# Patient Record
Sex: Female | Born: 1960 | Race: White | Hispanic: No | Smoking: Former smoker
Health system: Southern US, Community
[De-identification: ages and names within clinical notes are randomized; demographics above are authoritative.]

## PROBLEM LIST (undated history)

## (undated) DIAGNOSIS — I10 Essential (primary) hypertension: Secondary | ICD-10-CM

## (undated) DIAGNOSIS — N289 Disorder of kidney and ureter, unspecified: Secondary | ICD-10-CM

## (undated) DIAGNOSIS — K219 Gastro-esophageal reflux disease without esophagitis: Secondary | ICD-10-CM

## (undated) DIAGNOSIS — E119 Type 2 diabetes mellitus without complications: Secondary | ICD-10-CM

## (undated) DIAGNOSIS — K7460 Unspecified cirrhosis of liver: Secondary | ICD-10-CM

## (undated) HISTORY — DX: Gastro-esophageal reflux disease without esophagitis: K21.9

---

## 2001-01-06 ENCOUNTER — Encounter: Admission: RE | Admit: 2001-01-06 | Discharge: 2001-01-06 | Payer: Self-pay | Admitting: *Deleted

## 2001-01-06 ENCOUNTER — Encounter: Payer: Self-pay | Admitting: *Deleted

## 2001-04-16 ENCOUNTER — Encounter (INDEPENDENT_AMBULATORY_CARE_PROVIDER_SITE_OTHER): Payer: Self-pay | Admitting: Specialist

## 2001-04-16 ENCOUNTER — Ambulatory Visit (HOSPITAL_BASED_OUTPATIENT_CLINIC_OR_DEPARTMENT_OTHER): Admission: RE | Admit: 2001-04-16 | Discharge: 2001-04-16 | Payer: Self-pay | Admitting: Plastic Surgery

## 2002-02-19 ENCOUNTER — Encounter: Admission: RE | Admit: 2002-02-19 | Discharge: 2002-02-19 | Payer: Self-pay | Admitting: *Deleted

## 2002-02-19 ENCOUNTER — Encounter: Payer: Self-pay | Admitting: *Deleted

## 2003-08-26 ENCOUNTER — Ambulatory Visit (HOSPITAL_COMMUNITY): Admission: RE | Admit: 2003-08-26 | Discharge: 2003-08-26 | Payer: Self-pay | Admitting: Family Medicine

## 2004-01-27 ENCOUNTER — Other Ambulatory Visit: Admission: RE | Admit: 2004-01-27 | Discharge: 2004-01-27 | Payer: Self-pay | Admitting: Family Medicine

## 2004-06-01 ENCOUNTER — Other Ambulatory Visit: Admission: RE | Admit: 2004-06-01 | Discharge: 2004-06-01 | Payer: Self-pay | Admitting: Family Medicine

## 2004-10-05 ENCOUNTER — Ambulatory Visit (HOSPITAL_COMMUNITY): Admission: RE | Admit: 2004-10-05 | Discharge: 2004-10-05 | Payer: Self-pay | Admitting: Family Medicine

## 2004-12-21 ENCOUNTER — Other Ambulatory Visit: Admission: RE | Admit: 2004-12-21 | Discharge: 2004-12-21 | Payer: Self-pay | Admitting: Family Medicine

## 2005-06-18 ENCOUNTER — Other Ambulatory Visit: Admission: RE | Admit: 2005-06-18 | Discharge: 2005-06-18 | Payer: Self-pay | Admitting: Family Medicine

## 2005-12-13 ENCOUNTER — Ambulatory Visit (HOSPITAL_COMMUNITY): Admission: RE | Admit: 2005-12-13 | Discharge: 2005-12-13 | Payer: Self-pay | Admitting: Family Medicine

## 2005-12-13 ENCOUNTER — Other Ambulatory Visit: Admission: RE | Admit: 2005-12-13 | Discharge: 2005-12-13 | Payer: Self-pay | Admitting: Family Medicine

## 2006-12-19 ENCOUNTER — Ambulatory Visit (HOSPITAL_COMMUNITY): Admission: RE | Admit: 2006-12-19 | Discharge: 2006-12-19 | Payer: Self-pay | Admitting: Family Medicine

## 2007-02-09 ENCOUNTER — Other Ambulatory Visit: Admission: RE | Admit: 2007-02-09 | Discharge: 2007-02-09 | Payer: Self-pay | Admitting: Family Medicine

## 2007-12-28 ENCOUNTER — Ambulatory Visit (HOSPITAL_COMMUNITY): Admission: RE | Admit: 2007-12-28 | Discharge: 2007-12-28 | Payer: Self-pay | Admitting: Family Medicine

## 2008-12-29 ENCOUNTER — Ambulatory Visit (HOSPITAL_COMMUNITY): Admission: RE | Admit: 2008-12-29 | Discharge: 2008-12-29 | Payer: Self-pay | Admitting: Family Medicine

## 2010-01-01 ENCOUNTER — Ambulatory Visit (HOSPITAL_COMMUNITY): Admission: RE | Admit: 2010-01-01 | Discharge: 2010-01-01 | Payer: Self-pay | Admitting: Family Medicine

## 2010-07-27 NOTE — Op Note (Signed)
[place]. [facility]  Patient:    Chelsea Ross, Chelsea Ross Visit Number: [id number] MRN: [id number]          Service Type: DSU Location: [facility] Attending Physician:  Eloise Levels Dictated by:   Mary A. Contogiannis, M.D. Proc. 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  PROCEDURES: 1. Excision of 1.3 cm scar and wound, left nasal tip. 2. Complex closure of 2.7 cm left nasal scar.  SURGEON:  Mary A. Contogiannis, M.D.  ANESTHESIA:  1% lidocaine with epinephrine.  COMPLICATIONS:  None.  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 Although the pathology report indicated that it was excised with clear margins, the primary closure on the incision came open and the wound healed in by secondary intention.  At this point all the wound has healed in; however, there is a significant deformity on the patients nose.  She would like to see about having this excised and having a reclosure of the incision.  She asked [place] to proceed with the surgery at this time.  DESCRIPTION OF PROCEDURE:  The patient was brought into the minor room and placed on the table in the supine position.  The face was prepped with Betadine and draped in a sterile fashion.  The skin and subcutaneous tissues in the area of the scar were then injected with 1% lidocaine with epinephrine. After adequate hemostasis and anesthesia had taken effect, the procedure was begun.  The old scar tissue was sharply excised.  This was then passed off the table to undergo permanent pathologic section evaluation.  There was a significant amount of undermining of the skin flaps performed in  all directions to loosen the nasal skin.  The wound was then once again closed in complex fashion.  The deeper subcutaneous tissues were closed with 4-0 Monocryl suture.  The dermal layer was closed with 4-0 Monocryl suture as well.  The skin was then closed with a 6-0 Prolene interrupted simple sutures. The incision was dressed with bacitracin ointment and a Band-Aid.  There were no complications.  The patient tolerated the procedure well.  She was then taught proper wound care and discharged home in stable condition.  Follow-up appointment will be tomorrow in the office. Dictated by:   Mary A. Contogiannis, M.D. Attending Physician:  Eloise Levels DD:  04/17/01 TD:  04/18/01 Job: [phone] [id number]

## 2010-12-10 ENCOUNTER — Other Ambulatory Visit (HOSPITAL_COMMUNITY): Payer: Self-pay | Admitting: Family Medicine

## 2010-12-10 DIAGNOSIS — Z1231 Encounter for screening mammogram for malignant neoplasm of breast: Secondary | ICD-10-CM

## 2011-01-04 ENCOUNTER — Ambulatory Visit (HOSPITAL_COMMUNITY): Payer: Self-pay

## 2011-01-15 ENCOUNTER — Ambulatory Visit (HOSPITAL_COMMUNITY): Payer: Self-pay | Attending: Family Medicine

## 2011-02-21 ENCOUNTER — Ambulatory Visit (HOSPITAL_COMMUNITY)
Admission: RE | Admit: 2011-02-21 | Discharge: 2011-02-21 | Disposition: A | Discharge status: Home / Self Care | Payer: BC Managed Care – PPO | Source: Ambulatory Visit | Attending: Family Medicine | Admitting: Family Medicine

## 2011-02-21 DIAGNOSIS — Z1231 Encounter for screening mammogram for malignant neoplasm of breast: Secondary | ICD-10-CM | POA: Insufficient documentation

## 2012-03-17 ENCOUNTER — Other Ambulatory Visit (HOSPITAL_COMMUNITY): Payer: Self-pay | Admitting: Family Medicine

## 2012-03-17 DIAGNOSIS — Z1231 Encounter for screening mammogram for malignant neoplasm of breast: Secondary | ICD-10-CM

## 2012-03-26 ENCOUNTER — Ambulatory Visit (HOSPITAL_COMMUNITY)
Admission: RE | Admit: 2012-03-26 | Discharge: 2012-03-26 | Disposition: A | Discharge status: Home / Self Care | Payer: 59 | Source: Ambulatory Visit | Attending: Family Medicine | Admitting: Family Medicine

## 2012-03-26 DIAGNOSIS — Z1231 Encounter for screening mammogram for malignant neoplasm of breast: Secondary | ICD-10-CM

## 2013-03-22 ENCOUNTER — Other Ambulatory Visit (HOSPITAL_COMMUNITY): Payer: Self-pay | Admitting: Family Medicine

## 2013-03-22 DIAGNOSIS — Z1231 Encounter for screening mammogram for malignant neoplasm of breast: Secondary | ICD-10-CM

## 2013-03-30 ENCOUNTER — Ambulatory Visit (HOSPITAL_COMMUNITY)
Admission: RE | Admit: 2013-03-30 | Discharge: 2013-03-30 | Disposition: A | Discharge status: Home / Self Care | Payer: 59 | Source: Ambulatory Visit | Attending: Family Medicine | Admitting: Family Medicine

## 2013-03-30 DIAGNOSIS — Z1231 Encounter for screening mammogram for malignant neoplasm of breast: Secondary | ICD-10-CM | POA: Insufficient documentation

## 2014-02-28 ENCOUNTER — Other Ambulatory Visit (HOSPITAL_COMMUNITY): Payer: Self-pay | Admitting: Family Medicine

## 2014-02-28 DIAGNOSIS — Z1231 Encounter for screening mammogram for malignant neoplasm of breast: Secondary | ICD-10-CM

## 2014-03-30 ENCOUNTER — Other Ambulatory Visit (HOSPITAL_COMMUNITY)
Admission: RE | Admit: 2014-03-30 | Discharge: 2014-03-30 | Disposition: A | Discharge status: Home / Self Care | Payer: 59 | Source: Ambulatory Visit | Attending: Family Medicine | Admitting: Family Medicine

## 2014-03-30 ENCOUNTER — Other Ambulatory Visit: Payer: Self-pay | Admitting: Physician Assistant

## 2014-03-30 DIAGNOSIS — Z01419 Encounter for gynecological examination (general) (routine) without abnormal findings: Secondary | ICD-10-CM | POA: Insufficient documentation

## 2014-03-30 DIAGNOSIS — Z1151 Encounter for screening for human papillomavirus (HPV): Secondary | ICD-10-CM | POA: Diagnosis present

## 2014-03-31 ENCOUNTER — Ambulatory Visit (HOSPITAL_COMMUNITY)
Admission: RE | Admit: 2014-03-31 | Discharge: 2014-03-31 | Disposition: A | Discharge status: Home / Self Care | Payer: 59 | Source: Ambulatory Visit | Attending: Family Medicine | Admitting: Family Medicine

## 2014-03-31 DIAGNOSIS — Z1231 Encounter for screening mammogram for malignant neoplasm of breast: Secondary | ICD-10-CM | POA: Diagnosis present

## 2014-03-31 LAB — CYTOLOGY - PAP

## 2015-03-08 ENCOUNTER — Other Ambulatory Visit: Payer: Self-pay

## 2015-03-08 DIAGNOSIS — Z1231 Encounter for screening mammogram for malignant neoplasm of breast: Secondary | ICD-10-CM

## 2015-04-04 ENCOUNTER — Ambulatory Visit
Admission: RE | Admit: 2015-04-04 | Discharge: 2015-04-04 | Disposition: A | Discharge status: Home / Self Care | Payer: 59 | Source: Ambulatory Visit

## 2015-04-04 DIAGNOSIS — Z1231 Encounter for screening mammogram for malignant neoplasm of breast: Secondary | ICD-10-CM

## 2016-03-12 ENCOUNTER — Other Ambulatory Visit: Payer: Self-pay | Admitting: Family Medicine

## 2016-03-12 DIAGNOSIS — Z1231 Encounter for screening mammogram for malignant neoplasm of breast: Secondary | ICD-10-CM

## 2016-04-04 ENCOUNTER — Ambulatory Visit
Admission: RE | Admit: 2016-04-04 | Discharge: 2016-04-04 | Disposition: A | Discharge status: Home / Self Care | Payer: 59 | Source: Ambulatory Visit | Attending: Family Medicine | Admitting: Family Medicine

## 2016-04-04 DIAGNOSIS — Z1231 Encounter for screening mammogram for malignant neoplasm of breast: Secondary | ICD-10-CM | POA: Diagnosis not present

## 2016-04-29 DIAGNOSIS — E119 Type 2 diabetes mellitus without complications: Secondary | ICD-10-CM | POA: Diagnosis not present

## 2016-04-29 DIAGNOSIS — K219 Gastro-esophageal reflux disease without esophagitis: Secondary | ICD-10-CM | POA: Diagnosis not present

## 2016-05-29 DIAGNOSIS — R809 Proteinuria, unspecified: Secondary | ICD-10-CM | POA: Diagnosis not present

## 2016-06-17 ENCOUNTER — Other Ambulatory Visit: Payer: Self-pay | Admitting: Nurse Practitioner

## 2016-06-17 ENCOUNTER — Other Ambulatory Visit: Payer: Self-pay | Admitting: Nephrology

## 2016-06-17 DIAGNOSIS — I10 Essential (primary) hypertension: Secondary | ICD-10-CM | POA: Diagnosis not present

## 2016-06-17 DIAGNOSIS — E1129 Type 2 diabetes mellitus with other diabetic kidney complication: Secondary | ICD-10-CM | POA: Diagnosis not present

## 2016-06-17 DIAGNOSIS — R809 Proteinuria, unspecified: Secondary | ICD-10-CM

## 2016-06-21 ENCOUNTER — Ambulatory Visit
Admission: RE | Admit: 2016-06-21 | Discharge: 2016-06-21 | Disposition: A | Discharge status: Home / Self Care | Payer: 59 | Source: Ambulatory Visit | Attending: Nephrology | Admitting: Nephrology

## 2016-06-21 DIAGNOSIS — R809 Proteinuria, unspecified: Secondary | ICD-10-CM

## 2016-06-21 DIAGNOSIS — N281 Cyst of kidney, acquired: Secondary | ICD-10-CM | POA: Diagnosis not present

## 2016-06-26 ENCOUNTER — Other Ambulatory Visit: Payer: Self-pay | Admitting: Nephrology

## 2016-06-26 DIAGNOSIS — N281 Cyst of kidney, acquired: Secondary | ICD-10-CM

## 2016-07-08 ENCOUNTER — Ambulatory Visit
Admission: RE | Admit: 2016-07-08 | Discharge: 2016-07-08 | Disposition: A | Discharge status: Home / Self Care | Payer: 59 | Source: Ambulatory Visit | Attending: Nephrology | Admitting: Nephrology

## 2016-07-08 DIAGNOSIS — N281 Cyst of kidney, acquired: Secondary | ICD-10-CM

## 2016-07-08 MED ORDER — GADOBENATE DIMEGLUMINE 529 MG/ML IV SOLN
12.0000 mL | Freq: Once | INTRAVENOUS | Status: AC | PRN
  Administered 2016-07-08: 12 mL via INTRAVENOUS

## 2016-07-10 DIAGNOSIS — R809 Proteinuria, unspecified: Secondary | ICD-10-CM | POA: Diagnosis not present

## 2016-07-24 DIAGNOSIS — I10 Essential (primary) hypertension: Secondary | ICD-10-CM | POA: Diagnosis not present

## 2016-07-24 DIAGNOSIS — R809 Proteinuria, unspecified: Secondary | ICD-10-CM | POA: Diagnosis not present

## 2016-07-24 DIAGNOSIS — R938 Abnormal findings on diagnostic imaging of other specified body structures: Secondary | ICD-10-CM | POA: Diagnosis not present

## 2016-07-24 DIAGNOSIS — E1129 Type 2 diabetes mellitus with other diabetic kidney complication: Secondary | ICD-10-CM | POA: Diagnosis not present

## 2016-07-31 DIAGNOSIS — L570 Actinic keratosis: Secondary | ICD-10-CM | POA: Diagnosis not present

## 2016-07-31 DIAGNOSIS — L309 Dermatitis, unspecified: Secondary | ICD-10-CM | POA: Diagnosis not present

## 2016-07-31 DIAGNOSIS — D485 Neoplasm of uncertain behavior of skin: Secondary | ICD-10-CM | POA: Diagnosis not present

## 2016-08-01 DIAGNOSIS — K219 Gastro-esophageal reflux disease without esophagitis: Secondary | ICD-10-CM | POA: Diagnosis not present

## 2016-08-01 DIAGNOSIS — E119 Type 2 diabetes mellitus without complications: Secondary | ICD-10-CM | POA: Diagnosis not present

## 2016-08-01 DIAGNOSIS — K8689 Other specified diseases of pancreas: Secondary | ICD-10-CM | POA: Diagnosis not present

## 2016-08-14 DIAGNOSIS — R809 Proteinuria, unspecified: Secondary | ICD-10-CM | POA: Diagnosis not present

## 2016-08-21 DIAGNOSIS — K8689 Other specified diseases of pancreas: Secondary | ICD-10-CM | POA: Diagnosis not present

## 2016-09-03 DIAGNOSIS — R933 Abnormal findings on diagnostic imaging of other parts of digestive tract: Secondary | ICD-10-CM | POA: Diagnosis not present

## 2016-09-03 DIAGNOSIS — K8689 Other specified diseases of pancreas: Secondary | ICD-10-CM | POA: Diagnosis not present

## 2016-09-03 DIAGNOSIS — K861 Other chronic pancreatitis: Secondary | ICD-10-CM | POA: Diagnosis not present

## 2016-09-25 DIAGNOSIS — L570 Actinic keratosis: Secondary | ICD-10-CM | POA: Diagnosis not present

## 2016-10-23 DIAGNOSIS — E559 Vitamin D deficiency, unspecified: Secondary | ICD-10-CM | POA: Diagnosis not present

## 2016-10-23 DIAGNOSIS — R809 Proteinuria, unspecified: Secondary | ICD-10-CM | POA: Diagnosis not present

## 2016-10-29 DIAGNOSIS — I10 Essential (primary) hypertension: Secondary | ICD-10-CM | POA: Diagnosis not present

## 2016-10-29 DIAGNOSIS — R809 Proteinuria, unspecified: Secondary | ICD-10-CM | POA: Diagnosis not present

## 2016-12-04 ENCOUNTER — Other Ambulatory Visit (HOSPITAL_COMMUNITY): Payer: Self-pay | Admitting: Nephrology

## 2016-12-04 DIAGNOSIS — R809 Proteinuria, unspecified: Secondary | ICD-10-CM

## 2016-12-05 DIAGNOSIS — K219 Gastro-esophageal reflux disease without esophagitis: Secondary | ICD-10-CM | POA: Diagnosis not present

## 2016-12-05 DIAGNOSIS — L659 Nonscarring hair loss, unspecified: Secondary | ICD-10-CM | POA: Diagnosis not present

## 2016-12-05 DIAGNOSIS — E119 Type 2 diabetes mellitus without complications: Secondary | ICD-10-CM | POA: Diagnosis not present

## 2016-12-05 DIAGNOSIS — R790 Abnormal level of blood mineral: Secondary | ICD-10-CM | POA: Diagnosis not present

## 2016-12-05 DIAGNOSIS — I10 Essential (primary) hypertension: Secondary | ICD-10-CM | POA: Diagnosis not present

## 2016-12-05 DIAGNOSIS — Z0000 Encounter for general adult medical examination without abnormal findings: Secondary | ICD-10-CM | POA: Diagnosis not present

## 2016-12-11 ENCOUNTER — Other Ambulatory Visit: Payer: Self-pay | Admitting: Nephrology

## 2016-12-11 DIAGNOSIS — R809 Proteinuria, unspecified: Secondary | ICD-10-CM

## 2016-12-11 DIAGNOSIS — N281 Cyst of kidney, acquired: Secondary | ICD-10-CM

## 2016-12-17 ENCOUNTER — Emergency Department (HOSPITAL_BASED_OUTPATIENT_CLINIC_OR_DEPARTMENT_OTHER)
Admission: EM | Admit: 2016-12-17 | Discharge: 2016-12-17 | Disposition: A | Discharge status: Home / Self Care | Payer: 59 | Attending: Emergency Medicine | Admitting: Emergency Medicine

## 2016-12-17 ENCOUNTER — Emergency Department (HOSPITAL_BASED_OUTPATIENT_CLINIC_OR_DEPARTMENT_OTHER): Payer: 59

## 2016-12-17 ENCOUNTER — Encounter (HOSPITAL_BASED_OUTPATIENT_CLINIC_OR_DEPARTMENT_OTHER): Payer: Self-pay | Admitting: Emergency Medicine

## 2016-12-17 DIAGNOSIS — Z79899 Other long term (current) drug therapy: Secondary | ICD-10-CM | POA: Diagnosis not present

## 2016-12-17 DIAGNOSIS — Y929 Unspecified place or not applicable: Secondary | ICD-10-CM | POA: Insufficient documentation

## 2016-12-17 DIAGNOSIS — S199XXA Unspecified injury of neck, initial encounter: Secondary | ICD-10-CM | POA: Diagnosis not present

## 2016-12-17 DIAGNOSIS — Y9384 Activity, sleeping: Secondary | ICD-10-CM | POA: Diagnosis not present

## 2016-12-17 DIAGNOSIS — S02609A Fracture of mandible, unspecified, initial encounter for closed fracture: Secondary | ICD-10-CM

## 2016-12-17 DIAGNOSIS — E119 Type 2 diabetes mellitus without complications: Secondary | ICD-10-CM | POA: Insufficient documentation

## 2016-12-17 DIAGNOSIS — Z7984 Long term (current) use of oral hypoglycemic drugs: Secondary | ICD-10-CM | POA: Insufficient documentation

## 2016-12-17 DIAGNOSIS — Y999 Unspecified external cause status: Secondary | ICD-10-CM | POA: Insufficient documentation

## 2016-12-17 DIAGNOSIS — R55 Syncope and collapse: Secondary | ICD-10-CM | POA: Diagnosis not present

## 2016-12-17 DIAGNOSIS — S0101XA Laceration without foreign body of scalp, initial encounter: Secondary | ICD-10-CM | POA: Diagnosis not present

## 2016-12-17 DIAGNOSIS — Z87891 Personal history of nicotine dependence: Secondary | ICD-10-CM | POA: Diagnosis not present

## 2016-12-17 DIAGNOSIS — W06XXXA Fall from bed, initial encounter: Secondary | ICD-10-CM | POA: Insufficient documentation

## 2016-12-17 DIAGNOSIS — S0993XA Unspecified injury of face, initial encounter: Secondary | ICD-10-CM | POA: Diagnosis not present

## 2016-12-17 DIAGNOSIS — S0191XA Laceration without foreign body of unspecified part of head, initial encounter: Secondary | ICD-10-CM | POA: Diagnosis not present

## 2016-12-17 DIAGNOSIS — R42 Dizziness and giddiness: Secondary | ICD-10-CM | POA: Diagnosis not present

## 2016-12-17 DIAGNOSIS — S0181XA Laceration without foreign body of other part of head, initial encounter: Secondary | ICD-10-CM

## 2016-12-17 DIAGNOSIS — S02600A Fracture of unspecified part of body of mandible, initial encounter for closed fracture: Secondary | ICD-10-CM | POA: Diagnosis not present

## 2016-12-17 DIAGNOSIS — S0269XA Fracture of mandible of other specified site, initial encounter for closed fracture: Secondary | ICD-10-CM | POA: Diagnosis not present

## 2016-12-17 DIAGNOSIS — W19XXXA Unspecified fall, initial encounter: Secondary | ICD-10-CM

## 2016-12-17 HISTORY — DX: Disorder of kidney and ureter, unspecified: N28.9

## 2016-12-17 HISTORY — DX: Type 2 diabetes mellitus without complications: E11.9

## 2016-12-17 LAB — CBC WITH DIFFERENTIAL/PLATELET
Basophils Absolute: 0 10*3/uL (ref 0.0–0.1)
Basophils Relative: 0 %
EOS PCT: 1 %
Eosinophils Absolute: 0.1 10*3/uL (ref 0.0–0.7)
HEMATOCRIT: 47.5 % — AB (ref 36.0–46.0)
HEMOGLOBIN: 16.4 g/dL — AB (ref 12.0–15.0)
LYMPHS ABS: 1.8 10*3/uL (ref 0.7–4.0)
LYMPHS PCT: 20 %
MCH: 33.5 pg (ref 26.0–34.0)
MCHC: 34.5 g/dL (ref 30.0–36.0)
MCV: 97.1 fL (ref 78.0–100.0)
Monocytes Absolute: 0.6 10*3/uL (ref 0.1–1.0)
Monocytes Relative: 7 %
NEUTROS PCT: 72 %
Neutro Abs: 6.5 10*3/uL (ref 1.7–7.7)
Platelets: 219 10*3/uL (ref 150–400)
RBC: 4.89 MIL/uL (ref 3.87–5.11)
RDW: 12.4 % (ref 11.5–15.5)
WBC: 9 10*3/uL (ref 4.0–10.5)

## 2016-12-17 LAB — BASIC METABOLIC PANEL WITH GFR
Anion gap: 13 (ref 5–15)
BUN: 8 mg/dL (ref 6–20)
CO2: 24 mmol/L (ref 22–32)
Calcium: 9 mg/dL (ref 8.9–10.3)
Chloride: 99 mmol/L — ABNORMAL LOW (ref 101–111)
Creatinine, Ser: 0.62 mg/dL (ref 0.44–1.00)
GFR calc Af Amer: >60 mL/min
GFR calc non Af Amer: >60 mL/min
Glucose, Bld: 181 mg/dL — ABNORMAL HIGH (ref 65–99)
Potassium: 4.4 mmol/L (ref 3.5–5.1)
Sodium: 136 mmol/L (ref 135–145)

## 2016-12-17 NOTE — ED Notes (Signed)
ED Provider at bedside. 

## 2016-12-17 NOTE — ED Notes (Signed)
ED MD informed refuses EKG

## 2016-12-17 NOTE — ED Notes (Signed)
Pt declined to get EKG done RN Amy informed.

## 2016-12-17 NOTE — ED Triage Notes (Signed)
Last night was sleeping on couch and got up and felt dizzy and passed out, +LOC. Fell on carpet, pain to right jaw , chin and back of head.

## 2016-12-17 NOTE — ED Notes (Signed)
dermabond placed at bedside 

## 2016-12-17 NOTE — ED Provider Notes (Signed)
[place] DEPT [facility] Provider Note   CSN: [id number] Arrival date & time: 12/17/16  0704     History   Chief Complaint Chief Complaint  Patient presents with  . Fall    HPI Chelsea Ross is a 56 y.o. female.   Patient with a syncopal episode lat evening at 1830. Patient recalls waking up on he floor bleeding from her chin and bleeding from the back of her head. patient states she goes  To bed early so she was asleep at that time. Patient states her tetanus is up-to-date. Patient's only concern is for the chin laceration and bleeding from her back of her head. She also has the complaint of right jaw pain. None of this existed prior to the fall.        Past Medical History:  Diagnosis Date  . Diabetes mellitus without complication ([place])   . Renal disorder     There are no active problems to display for this patient.   History reviewed. No pertinent surgical history.  OB History    No data available       Home Medications    Prior to Admission medications   Medication Sig Start Date End Date Taking? Authorizing Provider  losartan (COZAAR) 50 MG tablet Take 75 mg by mouth daily.   Yes [provider]  metFORMIN (GLUCOPHAGE) 500 MG tablet Take by mouth 2 (two) times daily with a meal.   Yes [provider]    Family History No family history on file.  Social History Social History  Substance Use Topics  . Smoking status: Former Research scientist (life sciences)  . Smokeless tobacco: Never Used  . Alcohol use Yes     Comment: daily 2 drinks      Allergies   Patient has no known allergies.   Review of Systems Review of Systems  Constitutional: Negative for fever.  HENT: Positive for facial swelling. Negative for congestion and trouble swallowing.   Eyes: Negative for visual disturbance.  Respiratory: Negative for shortness of breath.   Cardiovascular: Negative for chest pain, palpitations and leg swelling.  Gastrointestinal: Negative for abdominal pain.    Genitourinary: Negative for dysuria.  Musculoskeletal: Negative for back pain.  Skin: Negative for rash.  Neurological: Negative for headaches.  Hematological: Does not bruise/bleed easily.  Psychiatric/Behavioral: Negative for confusion.     Physical Exam Updated Vital Signs BP (!) 143/85 (BP Location: Right Arm)   Pulse 92   Temp 98.7 F (37.1 C) (Oral)   Resp 16   Ht 1.651 m (5\' 5" )   Wt 54.4 kg (120 lb)   SpO2 100%   BMI 19.97 kg/m   Physical Exam  Constitutional: She is oriented to person, place, and time. She appears well-developed and well-nourished. No distress.  HENT:  The patient's oral pharynx without any abnormalities. Tender to palpation of the right jaw. No obvious deformity. Swelling there. No bleeding inside the mouth. 2 cm laceration underneath the chin that is already aligned and closed. 5 mm laceration to the back of his scalp with about a 2 x 2 centimeter area of swelling.  Eyes: Pupils are equal, round, and reactive to light. Conjunctivae and EOM are normal.  Neck: Normal range of motion. Neck supple.  Cardiovascular: Normal rate, regular rhythm and normal heart sounds.   Pulmonary/Chest: Effort normal and breath sounds normal. No respiratory distress.  Abdominal: Soft. Bowel sounds are normal. There is no tenderness.  Musculoskeletal: Normal range of motion.  Neurological: She is alert  and oriented to person, place, and time. No cranial nerve deficit or sensory deficit. She exhibits normal muscle tone. Coordination normal.  Skin: Skin is warm. No rash noted.  Nursing note and vitals reviewed.    ED Treatments / Results  Labs (all labs ordered are listed, but only abnormal results are displayed) Labs Reviewed  CBC WITH DIFFERENTIAL/PLATELET - Abnormal; Notable for the following:       Result Value   Hemoglobin 16.4 (*)    HCT 47.5 (*)    All other components within normal limits  BASIC METABOLIC PANEL - Abnormal; Notable for the following:     Chloride 99 (*)    Glucose, Bld 181 (*)    All other components within normal limits    EKG  EKG Interpretation None       Radiology Ct Head Wo Contrast  Result Date: 12/17/2016 CLINICAL DATA:  Dizziness after standing, with loss of consciousness and fall. Pain in the right side face. Diabetes. Pain in the back of the head. EXAM: CT HEAD WITHOUT CONTRAST CT MAXILLOFACIAL WITHOUT CONTRAST CT CERVICAL SPINE WITHOUT CONTRAST TECHNIQUE: Multidetector CT imaging of the head, cervical spine, and maxillofacial structures were performed using the standard protocol without intravenous contrast. Multiplanar CT image reconstructions of the cervical spine and maxillofacial structures were also generated. COMPARISON:  None. FINDINGS: CT HEAD FINDINGS Brain: The brainstem, cerebellum, cerebral peduncles, thalami, basal ganglia, basilar cisterns, and ventricular system appear within normal limits. No intracranial hemorrhage, mass lesion, or acute CVA. Vascular: Unremarkable Skull: No calvarial fracture is identified. Other: Gas in the soft tissues of the left right occipital scalp compatible with laceration, for example image 23/3. CT MAXILLOFACIAL FINDINGS Osseous: Fracture of the right mandibular condyle and of the condylar neck, with displacement of the condylar neck fracture and about 1.2 cm of overlap. I do not see a separate mandibular fracture. No other facial fracture is observed. Orbits: Unremarkable Sinuses: Minimal chronic left maxillary and left sphenoid sinusitis. Right middle terminate concha bullosa. Soft tissues: Unremarkable CT CERVICAL SPINE FINDINGS Alignment: Unremarkable Skull base and vertebrae: No fracture or acute bony abnormality Soft tissues and spinal canal: Faint disruption of tissue planes near the right mandibular condylar neck fracture, as expected. Disc levels: Disc bulge at C2-3 and small central disc protrusion at C3-4. Bilateral uncinate spurring at the C6-7 level, left greater  than right. Loss of disc height at C6-7. Upper chest: Unremarkable Other: No supplemental non-categorized findings. IMPRESSION: 1. Moderately displaced and slightly comminuted right mandibular condylar neck fracture, with suspected nondisplaced longitudinal fracture extending into the condyle articular surface. 2. Gas in the soft tissues of the left occipital scalp compatible with laceration. 3. No acute intracranial findings. No acute cervical spine fracture or subluxation. 4. Mild chronic sinusitis. 5. Mild spondylosis and degenerative disc disease in the cervical spine, without observed impingement. Electronically Signed   By: Van Clines M.D.   On: 12/17/2016 08:08   Ct Cervical Spine Wo Contrast  Result Date: 12/17/2016 CLINICAL DATA:  Dizziness after standing, with loss of consciousness and fall. Pain in the right side face. Diabetes. Pain in the back of the head. EXAM: CT HEAD WITHOUT CONTRAST CT MAXILLOFACIAL WITHOUT CONTRAST CT CERVICAL SPINE WITHOUT CONTRAST TECHNIQUE: Multidetector CT imaging of the head, cervical spine, and maxillofacial structures were performed using the standard protocol without intravenous contrast. Multiplanar CT image reconstructions of the cervical spine and maxillofacial structures were also generated. COMPARISON:  None. FINDINGS: CT HEAD FINDINGS Brain: The brainstem, cerebellum, cerebral  peduncles, thalami, basal ganglia, basilar cisterns, and ventricular system appear within normal limits. No intracranial hemorrhage, mass lesion, or acute CVA. Vascular: Unremarkable Skull: No calvarial fracture is identified. Other: Gas in the soft tissues of the left right occipital scalp compatible with laceration, for example image 23/3. CT MAXILLOFACIAL FINDINGS Osseous: Fracture of the right mandibular condyle and of the condylar neck, with displacement of the condylar neck fracture and about 1.2 cm of overlap. I do not see a separate mandibular fracture. No other facial  fracture is observed. Orbits: Unremarkable Sinuses: Minimal chronic left maxillary and left sphenoid sinusitis. Right middle terminate concha bullosa. Soft tissues: Unremarkable CT CERVICAL SPINE FINDINGS Alignment: Unremarkable Skull base and vertebrae: No fracture or acute bony abnormality Soft tissues and spinal canal: Faint disruption of tissue planes near the right mandibular condylar neck fracture, as expected. Disc levels: Disc bulge at C2-3 and small central disc protrusion at C3-4. Bilateral uncinate spurring at the C6-7 level, left greater than right. Loss of disc height at C6-7. Upper chest: Unremarkable Other: No supplemental non-categorized findings. IMPRESSION: 1. Moderately displaced and slightly comminuted right mandibular condylar neck fracture, with suspected nondisplaced longitudinal fracture extending into the condyle articular surface. 2. Gas in the soft tissues of the left occipital scalp compatible with laceration. 3. No acute intracranial findings. No acute cervical spine fracture or subluxation. 4. Mild chronic sinusitis. 5. Mild spondylosis and degenerative disc disease in the cervical spine, without observed impingement. Electronically Signed   By: Van Clines M.D.   On: 12/17/2016 08:08   Ct Maxillofacial Wo Cm  Result Date: 12/17/2016 CLINICAL DATA:  Dizziness after standing, with loss of consciousness and fall. Pain in the right side face. Diabetes. Pain in the back of the head. EXAM: CT HEAD WITHOUT CONTRAST CT MAXILLOFACIAL WITHOUT CONTRAST CT CERVICAL SPINE WITHOUT CONTRAST TECHNIQUE: Multidetector CT imaging of the head, cervical spine, and maxillofacial structures were performed using the standard protocol without intravenous contrast. Multiplanar CT image reconstructions of the cervical spine and maxillofacial structures were also generated. COMPARISON:  None. FINDINGS: CT HEAD FINDINGS Brain: The brainstem, cerebellum, cerebral peduncles, thalami, basal ganglia,  basilar cisterns, and ventricular system appear within normal limits. No intracranial hemorrhage, mass lesion, or acute CVA. Vascular: Unremarkable Skull: No calvarial fracture is identified. Other: Gas in the soft tissues of the left right occipital scalp compatible with laceration, for example image 23/3. CT MAXILLOFACIAL FINDINGS Osseous: Fracture of the right mandibular condyle and of the condylar neck, with displacement of the condylar neck fracture and about 1.2 cm of overlap. I do not see a separate mandibular fracture. No other facial fracture is observed. Orbits: Unremarkable Sinuses: Minimal chronic left maxillary and left sphenoid sinusitis. Right middle terminate concha bullosa. Soft tissues: Unremarkable CT CERVICAL SPINE FINDINGS Alignment: Unremarkable Skull base and vertebrae: No fracture or acute bony abnormality Soft tissues and spinal canal: Faint disruption of tissue planes near the right mandibular condylar neck fracture, as expected. Disc levels: Disc bulge at C2-3 and small central disc protrusion at C3-4. Bilateral uncinate spurring at the C6-7 level, left greater than right. Loss of disc height at C6-7. Upper chest: Unremarkable Other: No supplemental non-categorized findings. IMPRESSION: 1. Moderately displaced and slightly comminuted right mandibular condylar neck fracture, with suspected nondisplaced longitudinal fracture extending into the condyle articular surface. 2. Gas in the soft tissues of the left occipital scalp compatible with laceration. 3. No acute intracranial findings. No acute cervical spine fracture or subluxation. 4. Mild chronic sinusitis. 5. Mild spondylosis  and degenerative disc disease in the cervical spine, without observed impingement. Electronically Signed   By: Van Clines M.D.   On: 12/17/2016 08:08    Procedures Procedures (including critical care time)  Medications Ordered in ED Medications - No data to display   Initial Impression / Assessment  and Plan / ED Course  I have reviewed the triage vital signs and the nursing notes.  Pertinent labs & imaging results that were available during my care of the patient were reviewed by me and considered in my medical decision making (see chart for details).    Patient was small laceration that's closed to the back of the head no active bleeding. Laceration on the chin measures about 2 cm and is a line so it was Dermabond.  CT head neck and face without any acute injuries other than evidence of a proximal right-sided mandible fracture. No evidence of any open fracture in the mouth or bleeding in the mouth. Patient will require follow-up with ear nose and throat for this.  Patient for some reason refused either cardiac monitoring or EKG. Patient's labs without significant abnormalities other than hemoglobin hematocrit showed marked concentration. BUN/creatinine was normal.  Symptoms could be related to heavy drinking and perhaps passing out for that. Patient was signed out AMA because of refusal in for cardiac monitoring could not rule out any arrhythmia. By listening to the heart no obvious irregular heartbeats.   Explain to the patient why we needed the at least cardiac monitoring couldn't forego the EKG but she refused either one.      Final Clinical Impressions(s) / ED Diagnoses   Final diagnoses:  Fall, initial encounter  Laceration of scalp, initial encounter  Chin laceration, initial encounter  Closed fracture of jaw, initial encounter ([place])  Syncope, unspecified syncope type    New Prescriptions New Prescriptions   No medications on file     Fredia Sorrow, MD 12/17/16 1019

## 2016-12-17 NOTE — Discharge Instructions (Signed)
Follow-up with oral surgery ordered nose and throat. Technically oral surgery as the follow-up for the jaw fracture. But he had difficulty getting an appointment with them given ear nose and throat a call. Return for any new or worse symptoms. Return for any further passing out. Regarding the chin laceration it is been glued with Dermabond. No further treatment required. CTs negative except for showing evidence of the right-sided jaw fracture. Labs negative except showing evidence of dehydration.

## 2016-12-26 DIAGNOSIS — E559 Vitamin D deficiency, unspecified: Secondary | ICD-10-CM | POA: Diagnosis not present

## 2016-12-26 DIAGNOSIS — R809 Proteinuria, unspecified: Secondary | ICD-10-CM | POA: Diagnosis not present

## 2017-01-01 ENCOUNTER — Other Ambulatory Visit: Payer: Self-pay | Admitting: Student

## 2017-01-01 ENCOUNTER — Other Ambulatory Visit: Payer: Self-pay | Admitting: General Surgery

## 2017-01-02 ENCOUNTER — Other Ambulatory Visit: Payer: Self-pay | Admitting: Physician Assistant

## 2017-01-02 ENCOUNTER — Other Ambulatory Visit: Payer: Self-pay | Admitting: Radiology

## 2017-01-03 ENCOUNTER — Encounter (HOSPITAL_COMMUNITY): Payer: Self-pay

## 2017-01-03 ENCOUNTER — Ambulatory Visit (HOSPITAL_COMMUNITY)
Admission: RE | Admit: 2017-01-03 | Discharge: 2017-01-03 | Disposition: A | Discharge status: Home / Self Care | Payer: 59 | Source: Ambulatory Visit | Attending: Nephrology | Admitting: Nephrology

## 2017-01-03 DIAGNOSIS — N052 Unspecified nephritic syndrome with diffuse membranous glomerulonephritis: Secondary | ICD-10-CM | POA: Diagnosis not present

## 2017-01-03 DIAGNOSIS — K219 Gastro-esophageal reflux disease without esophagitis: Secondary | ICD-10-CM | POA: Insufficient documentation

## 2017-01-03 DIAGNOSIS — J323 Chronic sphenoidal sinusitis: Secondary | ICD-10-CM | POA: Insufficient documentation

## 2017-01-03 DIAGNOSIS — R42 Dizziness and giddiness: Secondary | ICD-10-CM | POA: Diagnosis not present

## 2017-01-03 DIAGNOSIS — N281 Cyst of kidney, acquired: Secondary | ICD-10-CM | POA: Insufficient documentation

## 2017-01-03 DIAGNOSIS — E119 Type 2 diabetes mellitus without complications: Secondary | ICD-10-CM | POA: Diagnosis not present

## 2017-01-03 DIAGNOSIS — Z79899 Other long term (current) drug therapy: Secondary | ICD-10-CM | POA: Insufficient documentation

## 2017-01-03 DIAGNOSIS — R809 Proteinuria, unspecified: Secondary | ICD-10-CM | POA: Insufficient documentation

## 2017-01-03 DIAGNOSIS — J320 Chronic maxillary sinusitis: Secondary | ICD-10-CM | POA: Diagnosis not present

## 2017-01-03 DIAGNOSIS — Z7984 Long term (current) use of oral hypoglycemic drugs: Secondary | ICD-10-CM | POA: Diagnosis not present

## 2017-01-03 DIAGNOSIS — Z87891 Personal history of nicotine dependence: Secondary | ICD-10-CM | POA: Diagnosis not present

## 2017-01-03 LAB — CBC
HCT: 46.4 % — ABNORMAL HIGH (ref 36.0–46.0)
Hemoglobin: 15.7 g/dL — ABNORMAL HIGH (ref 12.0–15.0)
MCH: 34.4 pg — ABNORMAL HIGH (ref 26.0–34.0)
MCHC: 33.8 g/dL (ref 30.0–36.0)
MCV: 101.8 fL — ABNORMAL HIGH (ref 78.0–100.0)
PLATELETS: 341 10*3/uL (ref 150–400)
RBC: 4.56 MIL/uL (ref 3.87–5.11)
RDW: 12.9 % (ref 11.5–15.5)
WBC: 6.6 10*3/uL (ref 4.0–10.5)

## 2017-01-03 LAB — GLUCOSE, CAPILLARY: Glucose-Capillary: 142 mg/dL — ABNORMAL HIGH (ref 65–99)

## 2017-01-03 LAB — PROTIME-INR
INR: 0.92
PROTHROMBIN TIME: 12.3 s (ref 11.4–15.2)

## 2017-01-03 MED ORDER — FENTANYL CITRATE (PF) 100 MCG/2ML IJ SOLN
INTRAMUSCULAR | Status: AC
  Filled 2017-01-03: qty 2

## 2017-01-03 MED ORDER — GELATIN ABSORBABLE 12-7 MM EX MISC
CUTANEOUS | Status: AC
  Filled 2017-01-03: qty 1

## 2017-01-03 MED ORDER — SODIUM CHLORIDE 0.9 % IV SOLN
INTRAVENOUS | Status: AC | PRN
  Administered 2017-01-03: 10 mL/h via INTRAVENOUS

## 2017-01-03 MED ORDER — HYDROCODONE-ACETAMINOPHEN 5-325 MG PO TABS: 1.0000 | ORAL_TABLET | ORAL | Status: DC | PRN

## 2017-01-03 MED ORDER — MIDAZOLAM HCL 2 MG/2ML IJ SOLN
INTRAMUSCULAR | Status: AC | PRN
  Administered 2017-01-03 (×2): 0.5 mg via INTRAVENOUS
  Administered 2017-01-03: 1 mg via INTRAVENOUS

## 2017-01-03 MED ORDER — SODIUM CHLORIDE 0.9 % IV SOLN: INTRAVENOUS | Status: DC

## 2017-01-03 MED ORDER — MIDAZOLAM HCL 2 MG/2ML IJ SOLN
INTRAMUSCULAR | Status: AC
  Filled 2017-01-03: qty 2

## 2017-01-03 MED ORDER — FENTANYL CITRATE (PF) 100 MCG/2ML IJ SOLN
INTRAMUSCULAR | Status: AC | PRN
  Administered 2017-01-03: 50 ug via INTRAVENOUS
  Administered 2017-01-03 (×2): 25 ug via INTRAVENOUS

## 2017-01-03 MED ORDER — LIDOCAINE HCL (PF) 1 % IJ SOLN
INTRAMUSCULAR | Status: AC
  Filled 2017-01-03: qty 30

## 2017-01-03 NOTE — Procedures (Signed)
  Procedure: [employer] renal biopsy 16g x3 Preprocedure diagnosis: Proteinuria Postprocedure diagnosis: same EBL:   minimal Complications:  none immediate  See full dictation in [facility].  Dillard Cannon MD Main # [phone] Pager  [phone]

## 2017-01-03 NOTE — Discharge Instructions (Addendum)
Percutaneous Kidney Biopsy °A kidney biopsy is a procedure to remove small pieces of tissue from a kidney. In a percutaneous biopsy, the tissue is removed using a needle that is inserted through the skin. This procedure is done so that the tissue can be examined under a microscope and checked for disease or infection. °Tell a health care provider about: °· Any allergies you have. °· All medicines you are taking, including vitamins, herbs, eye drops, creams, and over-the-counter medicines. °· Any problems you or family members have had with anesthetic medicines. °· Any blood disorders you have. °· Any surgeries you have had. °· Any medical conditions you have. °· Whether you are pregnant or may be pregnant. °What are the risks? °Generally, this is a safe procedure. However, problems may occur, including: °· Infection. °· Bleeding. °· Allergic reactions to medicines. °· Damage to other structures or organs. °· Swelling from a collection of clotted blood outside a blood vessel (hematoma). °· Blood in the urine (hematuria). ° °What happens before the procedure? °· Follow instructions from your health care provider about eating or drinking restrictions. °· Ask your health care provider about: °? Changing or stopping your regular medicines. This is especially important if you are taking diabetes medicines or blood thinners. °? Taking medicines such as aspirin and ibuprofen. These medicines can thin your blood. Do not take these medicines before your procedure if your health care provider instructs you not to. °· You may be given antibiotic medicine to help prevent infection. °· You will have blood and urine samples taken. This is to make sure that you do not have a condition where you should not have a biopsy. °· Plan to have someone take you home from the hospital or clinic. °· Ask your health care provider how your biopsy site will be marked or identified. °What happens during the procedure? °· To lower your risk of  infection: °? Your health care team will wash or sanitize their hands. °? Your skin will be washed with soap. °· An IV tube will be inserted into one of your veins. °· You will be given one or more of the following: °? A medicine to help you relax (sedative). °? A medicine to numb the area (local anesthetic). °· You will lie on your abdomen. A firm pillow will be placed under your body to help push the kidneys closer to the surface of the skin. If you have a transplanted kidney, you will lie on your back. °· The health care provider will mark the area where the needle will enter your skin. °· An imaging test--such as an ultrasound, X-ray, CT scan, or MRI--will be used to locate the kidney. These images will also help the health care provider to guide the biopsy needle into the kidney. °· You will be asked to hold your breath and stay still while the health care provider inserts the needle and removes the kidney tissue. °? You will need to hold your breath and stay still for 30-45 seconds. °? During the biopsy, you may hear a popping sound from the needle. °? You may also feel some pressure from the area where the needle is being inserted. °· The needle may be inserted and removed 3 or 4 times to make sure that enough tissue is taken for testing. °· A bandage (dressing) may be placed over the spot where the needle entered your skin (biopsy site). °The procedure may vary among health care providers and hospitals. °What happens after the procedure? °·   Your blood pressure, heart rate, breathing rate, and blood oxygen level will be monitored until the medicines you were given have worn off. °· You will need to lie on your back for 6-8 hours. °· You may have some pain or soreness near the biopsy site. °· You may have pink or cloudy urine from small amounts of blood. This is normal. °· You may have grogginess or fatigue if you were given a sedative. °· Do not drive for 24 hours if you were given a sedative. °· It is up to  you to get the results of your procedure. Ask your health care provider, or the department performing the procedure, when your results will be ready. °This information is not intended to replace advice given to you by your health care provider. Make sure you discuss any questions you have with your health care provider. °Document Released: 01/06/2004 Document Revised: 12/08/2015 Document Reviewed: 12/08/2015 °Elsevier Interactive Patient Education © 2018 [employer] °Moderate Conscious Sedation, Adult, Care After °These instructions provide you with information about caring for yourself after your procedure. Your health care provider may also give you more specific instructions. Your treatment has been planned according to current medical practices, but problems sometimes occur. Call your health care provider if you have any problems or questions after your procedure. °What can I expect after the procedure? °After your procedure, it is common: °· To feel sleepy for several hours. °· To feel clumsy and have poor balance for several hours. °· To have poor judgment for several hours. °· To vomit if you eat too soon. ° °Follow these instructions at home: °For at least 24 hours after the procedure: ° °· Do not: °? Participate in activities where you could fall or become injured. °? Drive. °? Use heavy machinery. °? Drink alcohol. °? Take sleeping pills or medicines that cause drowsiness. °? Make important decisions or sign legal documents. °? Take care of children on your own. °· Rest. °Eating and drinking °· Follow the diet recommended by your health care provider. °· If you vomit: °? Drink water, juice, or soup when you can drink without vomiting. °? Make sure you have little or no nausea before eating solid foods. °General instructions °· Have a responsible adult stay with you until you are awake and alert. °· Take over-the-counter and prescription medicines only as told by your health care provider. °· If you  smoke, do not smoke without supervision. °· Keep all follow-up visits as told by your health care provider. This is important. °Contact a health care provider if: °· You keep feeling nauseous or you keep vomiting. °· You feel light-headed. °· You develop a rash. °· You have a fever. °Get help right away if: °· You have trouble breathing. °This information is not intended to replace advice given to you by your health care provider. Make sure you discuss any questions you have with your health care provider. °Document Released: 12/16/2012 Document Revised: 07/31/2015 Document Reviewed: 06/17/2015 °Elsevier Interactive Patient Education © 2018 [employer] ° °

## 2017-01-03 NOTE — H&P (Signed)
Chief Complaint: Patient was seen in consultation today for proteinuria  Referring Physician(s): Patel,Jay  Supervising Physician: Arne Cleveland  Patient Status: [facility] - Out-pt  History of Present Illness: Chelsea Ross is a 56 y.o. female with past medical history of DM, GERD, and bilateral renal cysts who presents with complaint of proteinuria.  Patient was evaluated by her nephrologist who recommended renal biopsy.  IR consulted for random renal biopsy at the request of Dr. Posey Pronto.  She presents for procedure today in her usual state of health.  She has been NPO.  She does not take blood thinners.   Past Medical History:  Diagnosis Date  . Diabetes mellitus without complication ([place])   . Renal disorder     History reviewed. No pertinent surgical history.  Allergies: Patient has no known allergies.  Medications: Prior to Admission medications   Medication Sig Start Date End Date Taking? Authorizing Provider  losartan (COZAAR) 50 MG tablet Take 75 mg by mouth every morning.    Yes [provider]  magnesium oxide (MAG-OX) 400 MG tablet Take 800-1,200 mg by mouth daily.   Yes [provider]  metFORMIN (GLUCOPHAGE-XR) 500 MG 24 hr tablet Take 500 mg by mouth 2 (two) times daily. 10/16/16  Yes [provider]  Multiple Vitamins-Minerals (HAIR/SKIN/NAILS/BIOTIN PO) Take 1,300 mg by mouth daily.   Yes [provider]     Family History  Problem Relation Age of Onset  . Cancer Mother   . Hypertension Father   . Diabetes Father     Social History   Social History  . Marital status: Single    Spouse name: N/A  . Number of children: N/A  . Years of education: N/A   Social History Main Topics  . Smoking status: Former Research scientist (life sciences)  . Smokeless tobacco: Never Used  . Alcohol use Yes     Comment: daily 2 drinks   . Drug use: No  . Sexual activity: Not Asked   Other Topics Concern  . None   Social History Narrative  . None     Review of Systems  Constitutional: Negative for fatigue and fever.  Respiratory: Negative for cough and shortness of breath.   Cardiovascular: Negative for chest pain.  Gastrointestinal: Negative for abdominal pain.  Musculoskeletal: Negative for back pain.  Psychiatric/Behavioral: Negative for behavioral problems and confusion.    Vital Signs: BP 137/80   Pulse 69   Temp 97.7 F (36.5 C) (Oral)   Resp 16   Ht 5\' 5"  (1.651 m)   Wt 127 lb (57.6 kg)   SpO2 100%   BMI 21.13 kg/m   Physical Exam  Constitutional: She is oriented to person, place, and time. She appears well-developed.  Cardiovascular: Normal rate, regular rhythm and normal heart sounds.   Pulmonary/Chest: Effort normal and breath sounds normal. No respiratory distress.  Abdominal: Soft.  Neurological: She is alert and oriented to person, place, and time.  Skin: Skin is warm and dry.  Psychiatric: She has a normal mood and affect. Her behavior is normal. Judgment and thought content normal.  Nursing note and vitals reviewed.   Imaging: Ct Head Wo Contrast  Result Date: 12/17/2016 CLINICAL DATA:  Dizziness after standing, with loss of consciousness and fall. Pain in the right side face. Diabetes. Pain in the back of the head. EXAM: CT HEAD WITHOUT CONTRAST CT MAXILLOFACIAL WITHOUT CONTRAST CT CERVICAL SPINE WITHOUT CONTRAST TECHNIQUE: Multidetector CT imaging of the head, cervical spine, and maxillofacial structures were  performed using the standard protocol without intravenous contrast. Multiplanar CT image reconstructions of the cervical spine and maxillofacial structures were also generated. COMPARISON:  None. FINDINGS: CT HEAD FINDINGS Brain: The brainstem, cerebellum, cerebral peduncles, thalami, basal ganglia, basilar cisterns, and ventricular system appear within normal limits. No intracranial hemorrhage, mass lesion, or acute CVA. Vascular: Unremarkable Skull: No calvarial fracture is identified. Other: Gas  in the soft tissues of the left right occipital scalp compatible with laceration, for example image 23/3. CT MAXILLOFACIAL FINDINGS Osseous: Fracture of the right mandibular condyle and of the condylar neck, with displacement of the condylar neck fracture and about 1.2 cm of overlap. I do not see a separate mandibular fracture. No other facial fracture is observed. Orbits: Unremarkable Sinuses: Minimal chronic left maxillary and left sphenoid sinusitis. Right middle terminate concha bullosa. Soft tissues: Unremarkable CT CERVICAL SPINE FINDINGS Alignment: Unremarkable Skull base and vertebrae: No fracture or acute bony abnormality Soft tissues and spinal canal: Faint disruption of tissue planes near the right mandibular condylar neck fracture, as expected. Disc levels: Disc bulge at C2-3 and small central disc protrusion at C3-4. Bilateral uncinate spurring at the C6-7 level, left greater than right. Loss of disc height at C6-7. Upper chest: Unremarkable Other: No supplemental non-categorized findings. IMPRESSION: 1. Moderately displaced and slightly comminuted right mandibular condylar neck fracture, with suspected nondisplaced longitudinal fracture extending into the condyle articular surface. 2. Gas in the soft tissues of the left occipital scalp compatible with laceration. 3. No acute intracranial findings. No acute cervical spine fracture or subluxation. 4. Mild chronic sinusitis. 5. Mild spondylosis and degenerative disc disease in the cervical spine, without observed impingement. Electronically Signed   By: Van Clines M.D.   On: 12/17/2016 08:08   Ct Cervical Spine Wo Contrast  Result Date: 12/17/2016 CLINICAL DATA:  Dizziness after standing, with loss of consciousness and fall. Pain in the right side face. Diabetes. Pain in the back of the head. EXAM: CT HEAD WITHOUT CONTRAST CT MAXILLOFACIAL WITHOUT CONTRAST CT CERVICAL SPINE WITHOUT CONTRAST TECHNIQUE: Multidetector CT imaging of the head,  cervical spine, and maxillofacial structures were performed using the standard protocol without intravenous contrast. Multiplanar CT image reconstructions of the cervical spine and maxillofacial structures were also generated. COMPARISON:  None. FINDINGS: CT HEAD FINDINGS Brain: The brainstem, cerebellum, cerebral peduncles, thalami, basal ganglia, basilar cisterns, and ventricular system appear within normal limits. No intracranial hemorrhage, mass lesion, or acute CVA. Vascular: Unremarkable Skull: No calvarial fracture is identified. Other: Gas in the soft tissues of the left right occipital scalp compatible with laceration, for example image 23/3. CT MAXILLOFACIAL FINDINGS Osseous: Fracture of the right mandibular condyle and of the condylar neck, with displacement of the condylar neck fracture and about 1.2 cm of overlap. I do not see a separate mandibular fracture. No other facial fracture is observed. Orbits: Unremarkable Sinuses: Minimal chronic left maxillary and left sphenoid sinusitis. Right middle terminate concha bullosa. Soft tissues: Unremarkable CT CERVICAL SPINE FINDINGS Alignment: Unremarkable Skull base and vertebrae: No fracture or acute bony abnormality Soft tissues and spinal canal: Faint disruption of tissue planes near the right mandibular condylar neck fracture, as expected. Disc levels: Disc bulge at C2-3 and small central disc protrusion at C3-4. Bilateral uncinate spurring at the C6-7 level, left greater than right. Loss of disc height at C6-7. Upper chest: Unremarkable Other: No supplemental non-categorized findings. IMPRESSION: 1. Moderately displaced and slightly comminuted right mandibular condylar neck fracture, with suspected nondisplaced longitudinal fracture extending into the condyle  articular surface. 2. Gas in the soft tissues of the left occipital scalp compatible with laceration. 3. No acute intracranial findings. No acute cervical spine fracture or subluxation. 4. Mild  chronic sinusitis. 5. Mild spondylosis and degenerative disc disease in the cervical spine, without observed impingement. Electronically Signed   By: Van Clines M.D.   On: 12/17/2016 08:08   Ct Maxillofacial Wo Cm  Result Date: 12/17/2016 CLINICAL DATA:  Dizziness after standing, with loss of consciousness and fall. Pain in the right side face. Diabetes. Pain in the back of the head. EXAM: CT HEAD WITHOUT CONTRAST CT MAXILLOFACIAL WITHOUT CONTRAST CT CERVICAL SPINE WITHOUT CONTRAST TECHNIQUE: Multidetector CT imaging of the head, cervical spine, and maxillofacial structures were performed using the standard protocol without intravenous contrast. Multiplanar CT image reconstructions of the cervical spine and maxillofacial structures were also generated. COMPARISON:  None. FINDINGS: CT HEAD FINDINGS Brain: The brainstem, cerebellum, cerebral peduncles, thalami, basal ganglia, basilar cisterns, and ventricular system appear within normal limits. No intracranial hemorrhage, mass lesion, or acute CVA. Vascular: Unremarkable Skull: No calvarial fracture is identified. Other: Gas in the soft tissues of the left right occipital scalp compatible with laceration, for example image 23/3. CT MAXILLOFACIAL FINDINGS Osseous: Fracture of the right mandibular condyle and of the condylar neck, with displacement of the condylar neck fracture and about 1.2 cm of overlap. I do not see a separate mandibular fracture. No other facial fracture is observed. Orbits: Unremarkable Sinuses: Minimal chronic left maxillary and left sphenoid sinusitis. Right middle terminate concha bullosa. Soft tissues: Unremarkable CT CERVICAL SPINE FINDINGS Alignment: Unremarkable Skull base and vertebrae: No fracture or acute bony abnormality Soft tissues and spinal canal: Faint disruption of tissue planes near the right mandibular condylar neck fracture, as expected. Disc levels: Disc bulge at C2-3 and small central disc protrusion at C3-4.  Bilateral uncinate spurring at the C6-7 level, left greater than right. Loss of disc height at C6-7. Upper chest: Unremarkable Other: No supplemental non-categorized findings. IMPRESSION: 1. Moderately displaced and slightly comminuted right mandibular condylar neck fracture, with suspected nondisplaced longitudinal fracture extending into the condyle articular surface. 2. Gas in the soft tissues of the left occipital scalp compatible with laceration. 3. No acute intracranial findings. No acute cervical spine fracture or subluxation. 4. Mild chronic sinusitis. 5. Mild spondylosis and degenerative disc disease in the cervical spine, without observed impingement. Electronically Signed   By: Van Clines M.D.   On: 12/17/2016 08:08    Labs:  CBC:  Recent Labs  12/17/16 0740 01/03/17 0630  WBC 9.0 6.6  HGB 16.4* 15.7*  HCT 47.5* 46.4*  PLT 219 341    COAGS:  Recent Labs  01/03/17 0630  INR 0.92    BMP:  Recent Labs  12/17/16 0740  NA 136  K 4.4  CL 99*  CO2 24  GLUCOSE 181*  BUN 8  CALCIUM 9.0  CREATININE 0.62  GFRNONAA >60  GFRAA >60    LIVER FUNCTION TESTS: No results for input(s): BILITOT, AST, ALT, ALKPHOS, PROT, ALBUMIN in the last 8760 hours.  TUMOR MARKERS: No results for input(s): AFPTM, CEA, CA199, CHROMGRNA in the last 8760 hours.  Assessment and Plan: Patient with past medical history of DM and bilateral renal cysts presents with complaint of proteinuria.  IR consulted for random renal biopsy at the request of Dr. Posey Pronto. Patient presents today in their usual state of health.  She has been NPO and is not currently on blood thinners.  Risks and benefits  discussed with the patient including, but not limited to bleeding, infection, damage to adjacent structures or low yield requiring additional tests. All of the patient's questions were answered, patient is agreeable to proceed. Consent signed and in chart.  Thank you for this interesting consult.  I  greatly enjoyed meeting Chelsea Ross and look forward to participating in their care.  A copy of this report was sent to the requesting provider on this date.  Electronically Signed: Docia Barrier, PA 01/03/2017, 7:49 AM   I spent a total of  40 Minutes   in face to face in clinical consultation, greater than 50% of which was counseling/coordinating care for proteinuria.

## 2017-01-15 ENCOUNTER — Encounter (HOSPITAL_COMMUNITY): Payer: Self-pay

## 2017-01-17 ENCOUNTER — Ambulatory Visit
Admission: RE | Admit: 2017-01-17 | Discharge: 2017-01-17 | Disposition: A | Discharge status: Home / Self Care | Payer: 59 | Source: Ambulatory Visit | Attending: Nephrology | Admitting: Nephrology

## 2017-01-17 DIAGNOSIS — N281 Cyst of kidney, acquired: Secondary | ICD-10-CM

## 2017-01-17 DIAGNOSIS — R809 Proteinuria, unspecified: Secondary | ICD-10-CM

## 2017-02-13 DIAGNOSIS — D045 Carcinoma in situ of skin of trunk: Secondary | ICD-10-CM | POA: Diagnosis not present

## 2017-02-13 DIAGNOSIS — D485 Neoplasm of uncertain behavior of skin: Secondary | ICD-10-CM | POA: Diagnosis not present

## 2017-02-13 DIAGNOSIS — L719 Rosacea, unspecified: Secondary | ICD-10-CM | POA: Diagnosis not present

## 2017-02-13 DIAGNOSIS — L570 Actinic keratosis: Secondary | ICD-10-CM | POA: Diagnosis not present

## 2017-02-13 DIAGNOSIS — D225 Melanocytic nevi of trunk: Secondary | ICD-10-CM | POA: Diagnosis not present

## 2017-02-13 DIAGNOSIS — Z85828 Personal history of other malignant neoplasm of skin: Secondary | ICD-10-CM | POA: Diagnosis not present

## 2017-02-27 ENCOUNTER — Other Ambulatory Visit: Payer: Self-pay | Admitting: Family Medicine

## 2017-02-27 DIAGNOSIS — Z1231 Encounter for screening mammogram for malignant neoplasm of breast: Secondary | ICD-10-CM

## 2017-04-03 DIAGNOSIS — R809 Proteinuria, unspecified: Secondary | ICD-10-CM | POA: Diagnosis not present

## 2017-04-03 DIAGNOSIS — E1129 Type 2 diabetes mellitus with other diabetic kidney complication: Secondary | ICD-10-CM | POA: Diagnosis not present

## 2017-04-07 ENCOUNTER — Ambulatory Visit
Admission: RE | Admit: 2017-04-07 | Discharge: 2017-04-07 | Disposition: A | Discharge status: Home / Self Care | Payer: 59 | Source: Ambulatory Visit | Attending: Family Medicine | Admitting: Family Medicine

## 2017-04-07 DIAGNOSIS — Z1231 Encounter for screening mammogram for malignant neoplasm of breast: Secondary | ICD-10-CM | POA: Diagnosis not present

## 2017-04-11 DIAGNOSIS — I10 Essential (primary) hypertension: Secondary | ICD-10-CM | POA: Diagnosis not present

## 2017-04-11 DIAGNOSIS — N052 Unspecified nephritic syndrome with diffuse membranous glomerulonephritis: Secondary | ICD-10-CM | POA: Diagnosis not present

## 2017-04-11 DIAGNOSIS — R809 Proteinuria, unspecified: Secondary | ICD-10-CM | POA: Diagnosis not present

## 2017-04-14 DIAGNOSIS — L570 Actinic keratosis: Secondary | ICD-10-CM | POA: Diagnosis not present

## 2017-04-14 DIAGNOSIS — D045 Carcinoma in situ of skin of trunk: Secondary | ICD-10-CM | POA: Diagnosis not present

## 2017-05-02 DIAGNOSIS — R2232 Localized swelling, mass and lump, left upper limb: Secondary | ICD-10-CM | POA: Diagnosis not present

## 2017-05-07 ENCOUNTER — Other Ambulatory Visit: Payer: Self-pay | Admitting: Orthopedic Surgery

## 2017-05-07 DIAGNOSIS — R2232 Localized swelling, mass and lump, left upper limb: Secondary | ICD-10-CM | POA: Diagnosis not present

## 2017-05-09 ENCOUNTER — Other Ambulatory Visit: Payer: Self-pay

## 2017-05-09 ENCOUNTER — Encounter (HOSPITAL_BASED_OUTPATIENT_CLINIC_OR_DEPARTMENT_OTHER): Payer: Self-pay | Admitting: *Deleted

## 2017-05-12 ENCOUNTER — Encounter (HOSPITAL_BASED_OUTPATIENT_CLINIC_OR_DEPARTMENT_OTHER): Payer: Self-pay | Admitting: Anesthesiology

## 2017-05-12 ENCOUNTER — Other Ambulatory Visit: Payer: Self-pay

## 2017-05-12 ENCOUNTER — Ambulatory Visit (HOSPITAL_BASED_OUTPATIENT_CLINIC_OR_DEPARTMENT_OTHER)
Admission: RE | Admit: 2017-05-12 | Discharge: 2017-05-12 | Disposition: A | Discharge status: Home / Self Care | Payer: 59 | Source: Ambulatory Visit | Attending: Orthopedic Surgery | Admitting: Orthopedic Surgery

## 2017-05-12 ENCOUNTER — Encounter (HOSPITAL_BASED_OUTPATIENT_CLINIC_OR_DEPARTMENT_OTHER)
Admission: RE | Disposition: A | Discharge status: Home / Self Care | Payer: Self-pay | Source: Ambulatory Visit | Attending: Orthopedic Surgery

## 2017-05-12 ENCOUNTER — Ambulatory Visit (HOSPITAL_BASED_OUTPATIENT_CLINIC_OR_DEPARTMENT_OTHER): Payer: 59 | Admitting: Certified Registered"

## 2017-05-12 DIAGNOSIS — Z79899 Other long term (current) drug therapy: Secondary | ICD-10-CM | POA: Insufficient documentation

## 2017-05-12 DIAGNOSIS — Z7984 Long term (current) use of oral hypoglycemic drugs: Secondary | ICD-10-CM | POA: Diagnosis not present

## 2017-05-12 DIAGNOSIS — E119 Type 2 diabetes mellitus without complications: Secondary | ICD-10-CM | POA: Insufficient documentation

## 2017-05-12 DIAGNOSIS — Z87891 Personal history of nicotine dependence: Secondary | ICD-10-CM | POA: Diagnosis not present

## 2017-05-12 DIAGNOSIS — Q2731 Arteriovenous malformation of vessel of upper limb: Secondary | ICD-10-CM | POA: Diagnosis not present

## 2017-05-12 DIAGNOSIS — I10 Essential (primary) hypertension: Secondary | ICD-10-CM | POA: Diagnosis not present

## 2017-05-12 DIAGNOSIS — R2232 Localized swelling, mass and lump, left upper limb: Secondary | ICD-10-CM | POA: Diagnosis present

## 2017-05-12 DIAGNOSIS — I82602 Acute embolism and thrombosis of unspecified veins of left upper extremity: Secondary | ICD-10-CM | POA: Diagnosis not present

## 2017-05-12 HISTORY — PX: MASS EXCISION: SHX2000

## 2017-05-12 HISTORY — DX: Essential (primary) hypertension: I10

## 2017-05-12 LAB — POCT I-STAT, CHEM 8
BUN: 12 mg/dL (ref 6–20)
Calcium, Ion: 1.17 mmol/L (ref 1.15–1.40)
Chloride: 101 mmol/L (ref 101–111)
Creatinine, Ser: 0.5 mg/dL (ref 0.44–1.00)
Glucose, Bld: 142 mg/dL — ABNORMAL HIGH (ref 65–99)
HEMATOCRIT: 50 % — AB (ref 36.0–46.0)
HEMOGLOBIN: 17 g/dL — AB (ref 12.0–15.0)
Potassium: 4.3 mmol/L (ref 3.5–5.1)
SODIUM: 137 mmol/L (ref 135–145)
TCO2: 26 mmol/L (ref 22–32)

## 2017-05-12 LAB — GLUCOSE, CAPILLARY: GLUCOSE-CAPILLARY: 130 mg/dL — AB (ref 65–99)

## 2017-05-12 SURGERY — EXCISION MASS
Anesthesia: Monitor Anesthesia Care | Site: Ring Finger | Laterality: Left

## 2017-05-12 MED ORDER — OXYCODONE HCL 5 MG/5ML PO SOLN: 5.0000 mg | Freq: Once | ORAL | Status: DC | PRN

## 2017-05-12 MED ORDER — LACTATED RINGERS IV SOLN
INTRAVENOUS | Status: DC
  Administered 2017-05-12: 14:00:00 via INTRAVENOUS

## 2017-05-12 MED ORDER — CEFAZOLIN SODIUM-DEXTROSE 2-4 GM/100ML-% IV SOLN
INTRAVENOUS | Status: AC
  Filled 2017-05-12: qty 100

## 2017-05-12 MED ORDER — PROPOFOL 10 MG/ML IV BOLUS
INTRAVENOUS | Status: DC | PRN
  Administered 2017-05-12 (×3): 10 mg via INTRAVENOUS
  Administered 2017-05-12 (×2): 20 mg via INTRAVENOUS

## 2017-05-12 MED ORDER — MIDAZOLAM HCL 2 MG/2ML IJ SOLN: 1.0000 mg | INTRAMUSCULAR | Status: DC | PRN

## 2017-05-12 MED ORDER — HYDROCODONE-ACETAMINOPHEN 5-325 MG PO TABS: ORAL_TABLET | ORAL | 0 refills | Status: DC

## 2017-05-12 MED ORDER — PROPOFOL 10 MG/ML IV BOLUS
INTRAVENOUS | Status: AC
  Filled 2017-05-12: qty 20

## 2017-05-12 MED ORDER — HYDROMORPHONE HCL 1 MG/ML IJ SOLN: 0.2500 mg | INTRAMUSCULAR | Status: DC | PRN

## 2017-05-12 MED ORDER — ONDANSETRON HCL 4 MG/2ML IJ SOLN
INTRAMUSCULAR | Status: AC
  Filled 2017-05-12: qty 2

## 2017-05-12 MED ORDER — BUPIVACAINE HCL (PF) 0.25 % IJ SOLN
INTRAMUSCULAR | Status: DC | PRN
  Administered 2017-05-12: 8 mL

## 2017-05-12 MED ORDER — MIDAZOLAM HCL 5 MG/5ML IJ SOLN
INTRAMUSCULAR | Status: DC | PRN
  Administered 2017-05-12: 2 mg via INTRAVENOUS

## 2017-05-12 MED ORDER — FENTANYL CITRATE (PF) 100 MCG/2ML IJ SOLN
INTRAMUSCULAR | Status: AC
  Filled 2017-05-12: qty 2

## 2017-05-12 MED ORDER — LACTATED RINGERS IV SOLN: INTRAVENOUS | Status: DC

## 2017-05-12 MED ORDER — LIDOCAINE HCL (PF) 0.5 % IJ SOLN
INTRAMUSCULAR | Status: DC | PRN
  Administered 2017-05-12: 35 mL via INTRAVENOUS

## 2017-05-12 MED ORDER — MEPERIDINE HCL 25 MG/ML IJ SOLN: 6.2500 mg | INTRAMUSCULAR | Status: DC | PRN

## 2017-05-12 MED ORDER — FENTANYL CITRATE (PF) 100 MCG/2ML IJ SOLN: 50.0000 ug | INTRAMUSCULAR | Status: DC | PRN

## 2017-05-12 MED ORDER — FENTANYL CITRATE (PF) 100 MCG/2ML IJ SOLN
INTRAMUSCULAR | Status: DC | PRN
  Administered 2017-05-12: 50 ug via INTRAVENOUS

## 2017-05-12 MED ORDER — CEFAZOLIN SODIUM-DEXTROSE 2-4 GM/100ML-% IV SOLN
2.0000 g | INTRAVENOUS | Status: AC
  Administered 2017-05-12: 2 g via INTRAVENOUS

## 2017-05-12 MED ORDER — ONDANSETRON HCL 4 MG/2ML IJ SOLN
INTRAMUSCULAR | Status: DC | PRN
  Administered 2017-05-12: 4 mg via INTRAVENOUS

## 2017-05-12 MED ORDER — MIDAZOLAM HCL 2 MG/2ML IJ SOLN
INTRAMUSCULAR | Status: AC
  Filled 2017-05-12: qty 2

## 2017-05-12 MED ORDER — CHLORHEXIDINE GLUCONATE 4 % EX LIQD: 60.0000 mL | Freq: Once | CUTANEOUS | Status: DC

## 2017-05-12 MED ORDER — PROMETHAZINE HCL 25 MG/ML IJ SOLN: 6.2500 mg | INTRAMUSCULAR | Status: DC | PRN

## 2017-05-12 MED ORDER — SCOPOLAMINE 1 MG/3DAYS TD PT72: 1.0000 | MEDICATED_PATCH | Freq: Once | TRANSDERMAL | Status: DC | PRN

## 2017-05-12 MED ORDER — OXYCODONE HCL 5 MG PO TABS: 5.0000 mg | ORAL_TABLET | Freq: Once | ORAL | Status: DC | PRN

## 2017-05-12 SURGICAL SUPPLY — 56 items
APL SKNCLS STERI-STRIP NONHPOA (GAUZE/BANDAGES/DRESSINGS)
BANDAGE ACE 3X5.8 VEL STRL LF (GAUZE/BANDAGES/DRESSINGS) IMPLANT
BANDAGE COBAN STERILE 2 (GAUZE/BANDAGES/DRESSINGS) IMPLANT
BENZOIN TINCTURE PRP APPL 2/3 (GAUZE/BANDAGES/DRESSINGS) IMPLANT
BLADE MINI RND TIP GREEN BEAV (BLADE) IMPLANT
BLADE SURG 15 STRL LF DISP TIS (BLADE) ×2 IMPLANT
BLADE SURG 15 STRL SS (BLADE) ×4
BNDG CMPR 9X4 STRL LF SNTH (GAUZE/BANDAGES/DRESSINGS) ×1
BNDG COHESIVE 1X5 TAN STRL LF (GAUZE/BANDAGES/DRESSINGS) ×1 IMPLANT
BNDG CONFORM 2 STRL LF (GAUZE/BANDAGES/DRESSINGS) IMPLANT
BNDG ELASTIC 2X5.8 VLCR STR LF (GAUZE/BANDAGES/DRESSINGS) IMPLANT
BNDG ESMARK 4X9 LF (GAUZE/BANDAGES/DRESSINGS) ×1 IMPLANT
BNDG GAUZE 1X2.1 STRL (MISCELLANEOUS) IMPLANT
BNDG GAUZE ELAST 4 BULKY (GAUZE/BANDAGES/DRESSINGS) IMPLANT
BNDG PLASTER X FAST 3X3 WHT LF (CAST SUPPLIES) IMPLANT
BNDG PLSTR 9X3 FST ST WHT (CAST SUPPLIES)
CHLORAPREP W/TINT 26ML (MISCELLANEOUS) ×2 IMPLANT
CORD BIPOLAR FORCEPS 12FT (ELECTRODE) ×2 IMPLANT
COVER BACK TABLE 60X90IN (DRAPES) ×2 IMPLANT
COVER MAYO STAND STRL (DRAPES) ×2 IMPLANT
CUFF TOURNIQUET SINGLE 18IN (TOURNIQUET CUFF) ×2 IMPLANT
DRAPE EXTREMITY T 121X128X90 (DRAPE) ×2 IMPLANT
DRAPE SURG 17X23 STRL (DRAPES) ×2 IMPLANT
GAUZE SPONGE 4X4 12PLY STRL (GAUZE/BANDAGES/DRESSINGS) ×2 IMPLANT
GAUZE XEROFORM 1X8 LF (GAUZE/BANDAGES/DRESSINGS) ×2 IMPLANT
GLOVE BIO SURGEON STRL SZ7.5 (GLOVE) ×2 IMPLANT
GLOVE BIOGEL PI IND STRL 6.5 (GLOVE) IMPLANT
GLOVE BIOGEL PI IND STRL 7.0 (GLOVE) IMPLANT
GLOVE BIOGEL PI IND STRL 8 (GLOVE) ×1 IMPLANT
GLOVE BIOGEL PI INDICATOR 6.5 (GLOVE) ×1
GLOVE BIOGEL PI INDICATOR 7.0 (GLOVE) ×1
GLOVE BIOGEL PI INDICATOR 8 (GLOVE) ×2
GOWN STRL REUS W/ TWL LRG LVL3 (GOWN DISPOSABLE) ×1 IMPLANT
GOWN STRL REUS W/TWL LRG LVL3 (GOWN DISPOSABLE) ×2
GOWN STRL REUS W/TWL XL LVL3 (GOWN DISPOSABLE) ×2 IMPLANT
NDL HYPO 25X1 1.5 SAFETY (NEEDLE) ×1 IMPLANT
NEEDLE HYPO 25X1 1.5 SAFETY (NEEDLE) ×2 IMPLANT
NS IRRIG 1000ML POUR BTL (IV SOLUTION) ×2 IMPLANT
PACK BASIN DAY SURGERY FS (CUSTOM PROCEDURE TRAY) ×2 IMPLANT
PAD CAST 3X4 CTTN HI CHSV (CAST SUPPLIES) IMPLANT
PAD CAST 4YDX4 CTTN HI CHSV (CAST SUPPLIES) IMPLANT
PADDING CAST ABS 4INX4YD NS (CAST SUPPLIES)
PADDING CAST ABS COTTON 4X4 ST (CAST SUPPLIES) ×1 IMPLANT
PADDING CAST COTTON 3X4 STRL (CAST SUPPLIES)
PADDING CAST COTTON 4X4 STRL (CAST SUPPLIES)
STOCKINETTE 4X48 STRL (DRAPES) ×2 IMPLANT
STRIP CLOSURE SKIN 1/2X4 (GAUZE/BANDAGES/DRESSINGS) IMPLANT
SUT ETHILON 3 0 PS 1 (SUTURE) IMPLANT
SUT ETHILON 4 0 PS 2 18 (SUTURE) ×2 IMPLANT
SUT ETHILON 5 0 P 3 18 (SUTURE)
SUT NYLON ETHILON 5-0 P-3 1X18 (SUTURE) IMPLANT
SUT VIC AB 4-0 P2 18 (SUTURE) IMPLANT
SYR BULB 3OZ (MISCELLANEOUS) ×2 IMPLANT
SYR CONTROL 10ML LL (SYRINGE) ×2 IMPLANT
TOWEL OR 17X24 6PK STRL BLUE (TOWEL DISPOSABLE) ×4 IMPLANT
UNDERPAD 30X30 (UNDERPADS AND DIAPERS) ×2 IMPLANT

## 2017-05-12 NOTE — Brief Op Note (Signed)
05/12/2017  2:57 PM  PATIENT:  Chelsea Ross  57 y.o. female  PRE-OPERATIVE DIAGNOSIS:  left ring finger mass  R22.32  POST-OPERATIVE DIAGNOSIS:  * No post-op diagnosis entered *  PROCEDURE:  Procedure(s): EXCISION MASS LEFT RING FINGER (Left)  SURGEON:  Surgeon(s) and Role:    * Leanora Cover, MD - Primary  PHYSICIAN ASSISTANT:   ASSISTANTS: none   ANESTHESIA:   Bier block with sedation  EBL:  Minimal   BLOOD ADMINISTERED:none  DRAINS: none   LOCAL MEDICATIONS USED:  MARCAINE     SPECIMEN:  Source of Specimen:  left ring finger  DISPOSITION OF SPECIMEN:  PATHOLOGY  COUNTS:  YES  TOURNIQUET:   Total Tourniquet Time Documented: Forearm (Left) - 17 minutes Total: Forearm (Left) - 17 minutes   DICTATION: .Other Dictation: Dictation Number [id number]  PLAN OF CARE: Discharge to home after PACU  PATIENT DISPOSITION:  PACU - hemodynamically stable.

## 2017-05-12 NOTE — Op Note (Signed)
Chelsea Ross, Chelsea Ross                  ACCOUNT NO.:  [id number]  MEDICAL RECORD NO.:  [id number]  LOCATION:                                 FACILITY:  PHYSICIAN:  Leanora Cover, MD        DATE OF BIRTH:  [birth year]  DATE OF PROCEDURE:  05/12/2017 DATE OF DISCHARGE:                              OPERATIVE REPORT   PREOPERATIVE DIAGNOSIS:  Left ring finger mass.  POSTOPERATIVE DIAGNOSIS:  Left ring finger mass.  PROCEDURE:  Excision subcutaneous mass, left ring finger, 3 mm in diameter.  SURGEON:  Leanora Cover, MD.  ASSISTANT:  None.  ANESTHESIA:  Bier block with sedation.  IV FLUIDS:  Per anesthesia flow sheet.  ESTIMATED BLOOD LOSS:  Minimal.  COMPLICATIONS:  None.  SPECIMENS:  Left ring finger mass to Pathology.  TOURNIQUET TIME:  17 minutes.  DISPOSITION:  Stable to PACU.  INDICATIONS:  Chelsea Ross is a 57 year old female, who has had a mass in the left ring finger.  This was bothersome to her.  She wished to have it excised.  Risks, benefits, and alternatives of surgery were discussed including the risk of blood loss; infection; damage to nerves, vessels, tendons, ligaments, bone; failure of surgery; need for additional surgery; complications with wound healing, continued pain, and recurrence of mass.  She voiced understanding of these risks and elected to proceed.  OPERATIVE COURSE:  After being identified preoperatively by myself the patient, I agreed upon procedure and site of procedure.  Surgical site was marked.  Risks, benefits, and alternatives of surgery were reviewed and she wished to proceed.  Surgical consent had been signed.  She was given IV Ancef as preoperative antibiotic prophylaxis.  She was transferred to the operating room and placed on the operating room table in supine position with left upper extremity on arm board.  Bier block anesthesia was induced by anesthesiologist.  Left upper extremity was prepped and draped in normal sterile orthopedic  fashion.  Surgical pause was performed between surgeons, Anesthesia, and operating room staff and all were in agreement as to the patient, procedure, and site of procedure.  Tourniquet at the proximal aspect of the forearm had been inflated for the Bier block.  A small incision was made over the middle phalanx over top of the mass.  This was carried into subcutaneous tissues by spreading technique.  The mass was easily identified.  It was dark red in coloration.  It was easily freed from its surrounding soft tissue attachments.  There was a small vascular pedicle going to it. This was treated with bipolar electrocautery.  The mass measured approximately 3 mm in diameter.  It was sent to Pathology for examination.  Wound was explored and no remaining mass was noted.  The wound was copiously irrigated with sterile saline, closed with 4-0 nylon in a horizontal mattress fashion.  A digital block was performed with 0.25% plain Marcaine to aid in postoperative analgesia.  The wound was dressed with sterile Xeroform, 4x4, and wrapped with a Coban dressing lightly.  Tourniquet was deflated at 17 minutes.  Fingertips were pink with brisk capillary refill after deflation of tourniquet.  The operative  drapes were broken down and the patient was awoken from anesthesia safely.  She was transferred back to the stretcher and taken to PACU in stable condition.  I will see her back in the office in 1 week for postoperative followup.  I will give her Norco 5/325 one to two p.o. q.6 hours p.r.n. pain, dispensed #20.     Leanora Cover, MD     KK/MEDQ  D:  05/12/2017  T:  05/12/2017  Job:  [id number]

## 2017-05-12 NOTE — Discharge Instructions (Addendum)

## 2017-05-12 NOTE — Op Note (Signed)
[id number] 

## 2017-05-12 NOTE — H&P (Signed)
  Chelsea Ross is an 57 y.o. female.   Chief Complaint: left ring finger mass HPI: 57 yo female with mass left ring finger x 8 months.  She wishes to have this removed.  Allergies: No Known Allergies  Past Medical History:  Diagnosis Date  . Diabetes mellitus without complication ([place])   . Hypertension    pt states she does not have hypertension  . Renal disorder     History reviewed. No pertinent surgical history.  Family History: Family History  Problem Relation Age of Onset  . Cancer Mother   . Breast cancer Mother 30       mother had breast cancer 3 times  . Hypertension Father   . Diabetes Father     Social History:   reports that she has quit smoking. she has never used smokeless tobacco. She reports that she drinks alcohol. She reports that she does not use drugs.  Medications: Medications Prior to Admission  Medication Sig Dispense Refill  . Collagen 500 MG CAPS Take 1,000 mg by mouth.    . losartan (COZAAR) 50 MG tablet Take 100 mg by mouth every morning.     . magnesium oxide (MAG-OX) 400 MG tablet Take 800-1,200 mg by mouth daily.    . metFORMIN (GLUCOPHAGE-XR) 500 MG 24 hr tablet Take 500 mg by mouth 2 (two) times daily.    . Multiple Vitamins-Minerals (HAIR/SKIN/NAILS/BIOTIN PO) Take 1,300 mg by mouth daily.    Marland Kitchen zinc gluconate 50 MG tablet Take 50 mg by mouth daily.      No results found for this or any previous visit (from the past 48 hour(s)).  No results found.   A comprehensive review of systems was negative.  Blood pressure 139/87, pulse 83, temperature 98.1 F (36.7 C), temperature source Oral, resp. rate 16, height 5\' 5"  (1.651 m), weight 57.2 kg (126 lb), SpO2 100 %.  General appearance: alert, cooperative and appears stated age Head: Normocephalic, without obvious abnormality, atraumatic Neck: supple, symmetrical, trachea midline Cardio: regular rate and rhythm Resp: clear to auscultation bilaterally Extremities: Intact sensation and  capillary refill all digits.  +epl/fpl/io.  No wounds.  Pulses: 2+ and symmetric Skin: Skin color, texture, turgor normal. No rashes or lesions Neurologic: Grossly normal Incision/Wound:none  Assessment/Plan Left ring finger mass.  Non operative and operative treatment options were discussed with the patient and patient wishes to proceed with operative treatment.  Risks, benefits, and alternatives of surgery were discussed and the patient agrees with the plan of care.   Chelsea Ross R 05/12/2017, 1:33 PM

## 2017-05-12 NOTE — Anesthesia Postprocedure Evaluation (Signed)
Anesthesia Post Note  Patient: Chelsea Ross  Procedure(s) Performed: EXCISION MASS LEFT RING FINGER (Left Ring Finger)     Patient location during evaluation: PACU Anesthesia Type: MAC Level of consciousness: awake and alert Pain management: pain level controlled Vital Signs Assessment: post-procedure vital signs reviewed and stable Respiratory status: spontaneous breathing, nonlabored ventilation, respiratory function stable and patient connected to nasal cannula oxygen Cardiovascular status: stable and blood pressure returned to baseline Postop Assessment: no apparent nausea or vomiting Anesthetic complications: no    Last Vitals:  Vitals:   05/12/17 1515 05/12/17 1525  BP: 123/82 123/84  Pulse: 82 84  Resp: 15 16  Temp:  36.6 C  SpO2: 100% 100%    Last Pain:  Vitals:   05/12/17 1525  TempSrc: Oral                 Effie Berkshire

## 2017-05-12 NOTE — Anesthesia Preprocedure Evaluation (Addendum)
Anesthesia Evaluation  Patient identified by MRN, date of birth, ID band Patient awake    Reviewed: Allergy & Precautions, NPO status , Patient's Chart, lab work & pertinent test results  Airway Mallampati: I  TM Distance: >3 FB Neck ROM: Full    Dental  (+) Teeth Intact, Dental Advisory Given   Pulmonary former smoker,    breath sounds clear to auscultation       Cardiovascular hypertension, Pt. on medications  Rhythm:Regular Rate:Normal     Neuro/Psych negative neurological ROS  negative psych ROS   GI/Hepatic negative GI ROS, Neg liver ROS,   Endo/Other  diabetes, Type 2, Oral Hypoglycemic Agents  Renal/GU Renal disease     Musculoskeletal negative musculoskeletal ROS (+)   Abdominal Normal abdominal exam  (+)   Peds  Hematology negative hematology ROS (+)   Anesthesia Other Findings   Reproductive/Obstetrics                            Lab Results  Component Value Date   CREATININE 0.50 05/12/2017   BUN 12 05/12/2017   NA 137 05/12/2017   K 4.3 05/12/2017   CL 101 05/12/2017   CO2 24 12/17/2016   EKG: normal sinus rhythm.  Anesthesia Physical Anesthesia Plan  ASA: II  Anesthesia Plan: Bier Block and MAC and Bier Block-LIDOCAINE ONLY   Post-op Pain Management:    Induction: Intravenous  PONV Risk Score and Plan: 3 and Ondansetron, Propofol infusion and Midazolam  Airway Management Planned: Simple Face Mask  Additional Equipment: None  Intra-op Plan:   Post-operative Plan:   Informed Consent: I have reviewed the patients History and Physical, chart, labs and discussed the procedure including the risks, benefits and alternatives for the proposed anesthesia with the patient or authorized representative who has indicated his/her understanding and acceptance.     Plan Discussed with: CRNA  Anesthesia Plan Comments:        Anesthesia Quick Evaluation

## 2017-05-12 NOTE — Transfer of Care (Signed)
Immediate Anesthesia Transfer of Care Note  Patient: Chelsea Ross  Procedure(s) Performed: EXCISION MASS LEFT RING FINGER (Left Ring Finger)  Patient Location: PACU  Anesthesia Type:MAC and Bier block  Level of Consciousness: awake, alert , oriented and patient cooperative  Airway & Oxygen Therapy: Patient Spontanous Breathing  Post-op Assessment: Report given to RN and Post -op Vital signs reviewed and stable  Post vital signs: Reviewed and stable  Last Vitals:  Vitals:   05/12/17 1323 05/12/17 1501  BP: 139/87 (P) 126/79  Pulse: 83 79  Resp: 16 14  Temp: 36.7 C (P) 36.8 C  SpO2: 100% 100%    Last Pain:  Vitals:   05/12/17 1323  TempSrc: Oral         Complications: No apparent anesthesia complications

## 2017-05-13 ENCOUNTER — Encounter (HOSPITAL_BASED_OUTPATIENT_CLINIC_OR_DEPARTMENT_OTHER): Payer: Self-pay | Admitting: Orthopedic Surgery

## 2017-06-18 DIAGNOSIS — N052 Unspecified nephritic syndrome with diffuse membranous glomerulonephritis: Secondary | ICD-10-CM | POA: Diagnosis not present

## 2017-06-18 DIAGNOSIS — E119 Type 2 diabetes mellitus without complications: Secondary | ICD-10-CM | POA: Diagnosis not present

## 2017-06-24 DIAGNOSIS — N052 Unspecified nephritic syndrome with diffuse membranous glomerulonephritis: Secondary | ICD-10-CM | POA: Diagnosis not present

## 2017-06-24 DIAGNOSIS — E559 Vitamin D deficiency, unspecified: Secondary | ICD-10-CM | POA: Diagnosis not present

## 2017-06-24 DIAGNOSIS — I10 Essential (primary) hypertension: Secondary | ICD-10-CM | POA: Diagnosis not present

## 2017-07-30 DIAGNOSIS — N052 Unspecified nephritic syndrome with diffuse membranous glomerulonephritis: Secondary | ICD-10-CM | POA: Diagnosis not present

## 2017-08-21 DIAGNOSIS — E1129 Type 2 diabetes mellitus with other diabetic kidney complication: Secondary | ICD-10-CM | POA: Diagnosis not present

## 2017-08-21 DIAGNOSIS — N052 Unspecified nephritic syndrome with diffuse membranous glomerulonephritis: Secondary | ICD-10-CM | POA: Diagnosis not present

## 2017-08-28 DIAGNOSIS — I10 Essential (primary) hypertension: Secondary | ICD-10-CM | POA: Diagnosis not present

## 2017-08-28 DIAGNOSIS — E559 Vitamin D deficiency, unspecified: Secondary | ICD-10-CM | POA: Diagnosis not present

## 2017-08-28 DIAGNOSIS — N052 Unspecified nephritic syndrome with diffuse membranous glomerulonephritis: Secondary | ICD-10-CM | POA: Diagnosis not present

## 2017-09-01 DIAGNOSIS — L989 Disorder of the skin and subcutaneous tissue, unspecified: Secondary | ICD-10-CM | POA: Diagnosis not present

## 2017-09-01 DIAGNOSIS — H00022 Hordeolum internum right lower eyelid: Secondary | ICD-10-CM | POA: Diagnosis not present

## 2017-09-04 DIAGNOSIS — L820 Inflamed seborrheic keratosis: Secondary | ICD-10-CM | POA: Diagnosis not present

## 2017-09-04 DIAGNOSIS — D485 Neoplasm of uncertain behavior of skin: Secondary | ICD-10-CM | POA: Diagnosis not present

## 2017-09-04 DIAGNOSIS — C44729 Squamous cell carcinoma of skin of left lower limb, including hip: Secondary | ICD-10-CM | POA: Diagnosis not present

## 2017-09-04 DIAGNOSIS — L570 Actinic keratosis: Secondary | ICD-10-CM | POA: Diagnosis not present

## 2017-09-04 DIAGNOSIS — L821 Other seborrheic keratosis: Secondary | ICD-10-CM | POA: Diagnosis not present

## 2017-09-22 DIAGNOSIS — C44729 Squamous cell carcinoma of skin of left lower limb, including hip: Secondary | ICD-10-CM | POA: Diagnosis not present

## 2017-09-25 DIAGNOSIS — N052 Unspecified nephritic syndrome with diffuse membranous glomerulonephritis: Secondary | ICD-10-CM | POA: Diagnosis not present

## 2017-10-14 DIAGNOSIS — S0181XA Laceration without foreign body of other part of head, initial encounter: Secondary | ICD-10-CM | POA: Diagnosis not present

## 2017-10-27 DIAGNOSIS — E1129 Type 2 diabetes mellitus with other diabetic kidney complication: Secondary | ICD-10-CM | POA: Diagnosis not present

## 2017-10-27 DIAGNOSIS — N052 Unspecified nephritic syndrome with diffuse membranous glomerulonephritis: Secondary | ICD-10-CM | POA: Diagnosis not present

## 2017-10-30 DIAGNOSIS — E559 Vitamin D deficiency, unspecified: Secondary | ICD-10-CM | POA: Diagnosis not present

## 2017-10-30 DIAGNOSIS — I10 Essential (primary) hypertension: Secondary | ICD-10-CM | POA: Diagnosis not present

## 2017-10-30 DIAGNOSIS — N052 Unspecified nephritic syndrome with diffuse membranous glomerulonephritis: Secondary | ICD-10-CM | POA: Diagnosis not present

## 2017-11-03 DIAGNOSIS — L570 Actinic keratosis: Secondary | ICD-10-CM | POA: Diagnosis not present

## 2017-11-20 DIAGNOSIS — N052 Unspecified nephritic syndrome with diffuse membranous glomerulonephritis: Secondary | ICD-10-CM | POA: Diagnosis not present

## 2017-11-26 DIAGNOSIS — N052 Unspecified nephritic syndrome with diffuse membranous glomerulonephritis: Secondary | ICD-10-CM | POA: Diagnosis not present

## 2017-11-26 DIAGNOSIS — I10 Essential (primary) hypertension: Secondary | ICD-10-CM | POA: Diagnosis not present

## 2017-12-16 ENCOUNTER — Other Ambulatory Visit: Payer: Self-pay | Admitting: Family Medicine

## 2017-12-16 ENCOUNTER — Other Ambulatory Visit (HOSPITAL_COMMUNITY)
Admission: RE | Admit: 2017-12-16 | Discharge: 2017-12-16 | Disposition: A | Discharge status: Home / Self Care | Payer: 59 | Source: Ambulatory Visit | Attending: Family Medicine | Admitting: Family Medicine

## 2017-12-16 DIAGNOSIS — Z124 Encounter for screening for malignant neoplasm of cervix: Secondary | ICD-10-CM | POA: Diagnosis not present

## 2017-12-16 DIAGNOSIS — M8580 Other specified disorders of bone density and structure, unspecified site: Secondary | ICD-10-CM | POA: Diagnosis not present

## 2017-12-16 DIAGNOSIS — I10 Essential (primary) hypertension: Secondary | ICD-10-CM | POA: Diagnosis not present

## 2017-12-16 DIAGNOSIS — E1169 Type 2 diabetes mellitus with other specified complication: Secondary | ICD-10-CM | POA: Diagnosis not present

## 2017-12-16 DIAGNOSIS — Z0000 Encounter for general adult medical examination without abnormal findings: Secondary | ICD-10-CM | POA: Diagnosis not present

## 2017-12-19 LAB — CYTOLOGY - PAP
DIAGNOSIS: NEGATIVE
HPV: NOT DETECTED

## 2017-12-24 DIAGNOSIS — Z13220 Encounter for screening for lipoid disorders: Secondary | ICD-10-CM | POA: Diagnosis not present

## 2017-12-24 DIAGNOSIS — E559 Vitamin D deficiency, unspecified: Secondary | ICD-10-CM | POA: Diagnosis not present

## 2017-12-24 DIAGNOSIS — E1169 Type 2 diabetes mellitus with other specified complication: Secondary | ICD-10-CM | POA: Diagnosis not present

## 2017-12-24 DIAGNOSIS — N052 Unspecified nephritic syndrome with diffuse membranous glomerulonephritis: Secondary | ICD-10-CM | POA: Diagnosis not present

## 2017-12-31 DIAGNOSIS — N052 Unspecified nephritic syndrome with diffuse membranous glomerulonephritis: Secondary | ICD-10-CM | POA: Diagnosis not present

## 2017-12-31 DIAGNOSIS — I10 Essential (primary) hypertension: Secondary | ICD-10-CM | POA: Diagnosis not present

## 2017-12-31 DIAGNOSIS — E559 Vitamin D deficiency, unspecified: Secondary | ICD-10-CM | POA: Diagnosis not present

## 2018-01-16 DIAGNOSIS — E119 Type 2 diabetes mellitus without complications: Secondary | ICD-10-CM | POA: Diagnosis not present

## 2018-01-16 DIAGNOSIS — Z794 Long term (current) use of insulin: Secondary | ICD-10-CM | POA: Diagnosis not present

## 2018-02-10 DIAGNOSIS — B079 Viral wart, unspecified: Secondary | ICD-10-CM | POA: Diagnosis not present

## 2018-02-10 DIAGNOSIS — L570 Actinic keratosis: Secondary | ICD-10-CM | POA: Diagnosis not present

## 2018-02-10 DIAGNOSIS — L658 Other specified nonscarring hair loss: Secondary | ICD-10-CM | POA: Diagnosis not present

## 2018-02-10 DIAGNOSIS — D485 Neoplasm of uncertain behavior of skin: Secondary | ICD-10-CM | POA: Diagnosis not present

## 2018-02-11 DIAGNOSIS — N052 Unspecified nephritic syndrome with diffuse membranous glomerulonephritis: Secondary | ICD-10-CM | POA: Diagnosis not present

## 2018-02-11 DIAGNOSIS — E559 Vitamin D deficiency, unspecified: Secondary | ICD-10-CM | POA: Diagnosis not present

## 2018-02-18 DIAGNOSIS — E559 Vitamin D deficiency, unspecified: Secondary | ICD-10-CM | POA: Diagnosis not present

## 2018-02-18 DIAGNOSIS — N052 Unspecified nephritic syndrome with diffuse membranous glomerulonephritis: Secondary | ICD-10-CM | POA: Diagnosis not present

## 2018-02-18 DIAGNOSIS — I10 Essential (primary) hypertension: Secondary | ICD-10-CM | POA: Diagnosis not present

## 2018-03-18 ENCOUNTER — Other Ambulatory Visit: Payer: Self-pay | Admitting: Family Medicine

## 2018-03-18 DIAGNOSIS — Z1231 Encounter for screening mammogram for malignant neoplasm of breast: Secondary | ICD-10-CM

## 2018-04-14 DIAGNOSIS — L570 Actinic keratosis: Secondary | ICD-10-CM | POA: Diagnosis not present

## 2018-04-15 ENCOUNTER — Ambulatory Visit
Admission: RE | Admit: 2018-04-15 | Discharge: 2018-04-15 | Disposition: A | Discharge status: Home / Self Care | Payer: 59 | Source: Ambulatory Visit | Attending: Family Medicine | Admitting: Family Medicine

## 2018-04-15 DIAGNOSIS — Z1231 Encounter for screening mammogram for malignant neoplasm of breast: Secondary | ICD-10-CM

## 2018-05-06 DIAGNOSIS — N052 Unspecified nephritic syndrome with diffuse membranous glomerulonephritis: Secondary | ICD-10-CM | POA: Diagnosis not present

## 2018-05-06 DIAGNOSIS — E559 Vitamin D deficiency, unspecified: Secondary | ICD-10-CM | POA: Diagnosis not present

## 2018-05-15 DIAGNOSIS — I10 Essential (primary) hypertension: Secondary | ICD-10-CM | POA: Diagnosis not present

## 2018-05-15 DIAGNOSIS — N052 Unspecified nephritic syndrome with diffuse membranous glomerulonephritis: Secondary | ICD-10-CM | POA: Diagnosis not present

## 2019-03-19 ENCOUNTER — Other Ambulatory Visit: Payer: Self-pay | Admitting: Family Medicine

## 2019-03-19 DIAGNOSIS — Z1231 Encounter for screening mammogram for malignant neoplasm of breast: Secondary | ICD-10-CM

## 2019-04-28 ENCOUNTER — Ambulatory Visit
Admission: RE | Admit: 2019-04-28 | Discharge: 2019-04-28 | Disposition: A | Discharge status: Home / Self Care | Payer: 59 | Source: Ambulatory Visit | Attending: Family Medicine | Admitting: Family Medicine

## 2019-04-28 ENCOUNTER — Other Ambulatory Visit: Payer: Self-pay

## 2019-04-28 DIAGNOSIS — Z1231 Encounter for screening mammogram for malignant neoplasm of breast: Secondary | ICD-10-CM

## 2019-06-07 ENCOUNTER — Other Ambulatory Visit: Payer: Self-pay | Admitting: Physician Assistant

## 2019-08-27 IMAGING — MG DIGITAL SCREENING BILATERAL MAMMOGRAM WITH CAD
5 series · 5 of 5 positions shown · non-contrast
Comparison: Previous exam(s).

CLINICAL DATA: Screening.

EXAM:
DIGITAL SCREENING BILATERAL MAMMOGRAM WITH CAD

[R CC]
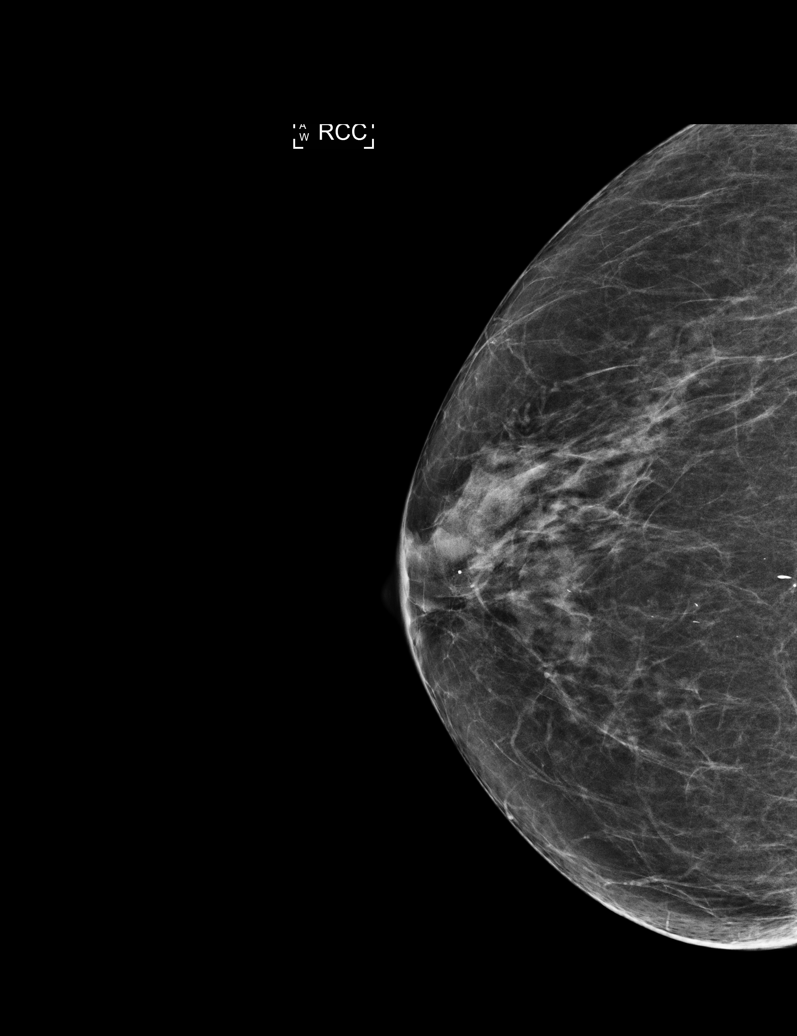

[R MLO (1 of 2)]
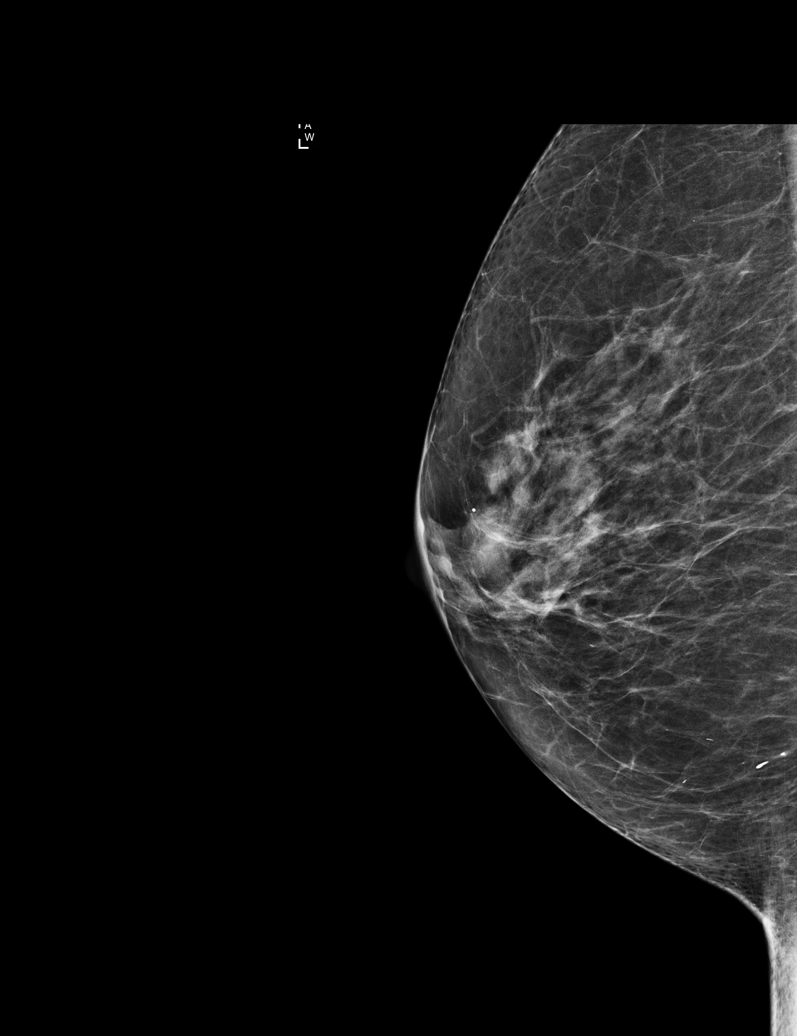

[L CC]
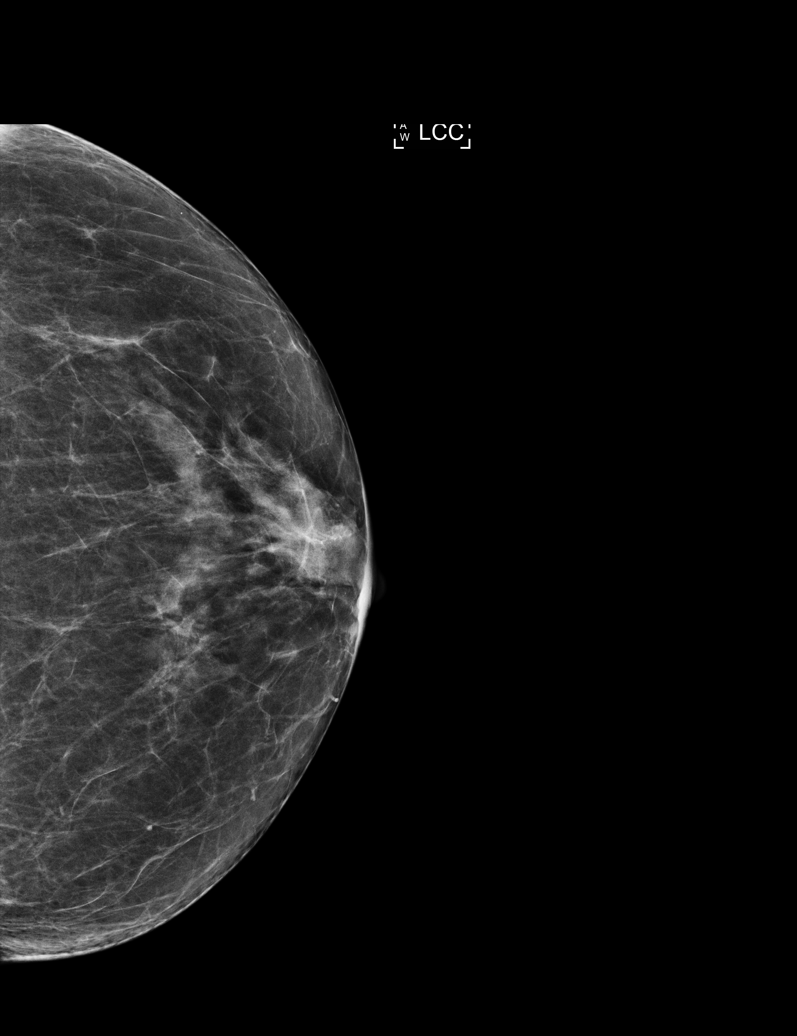

[R MLO (2 of 2)]
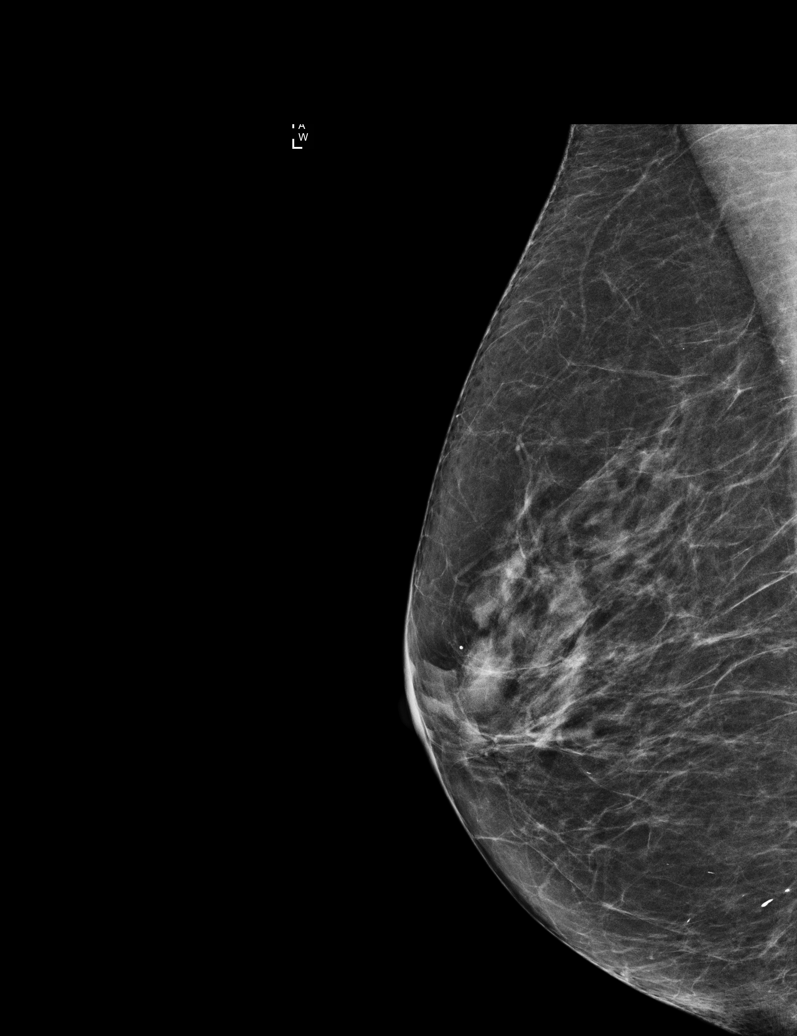

[L MLO]
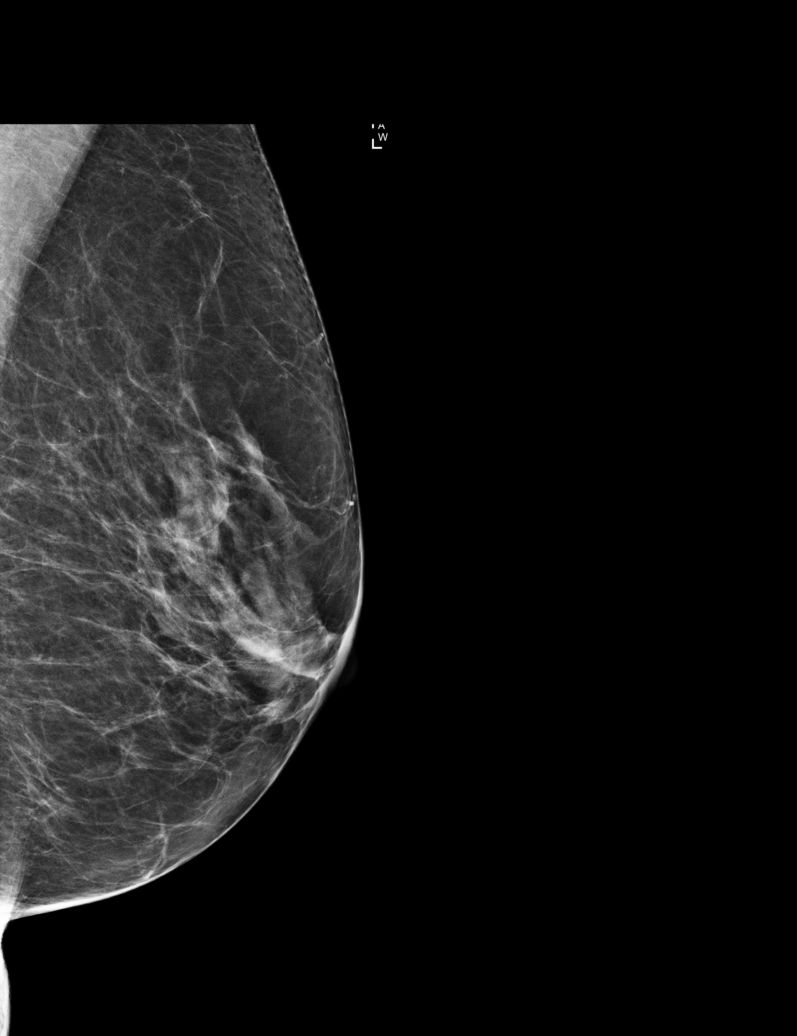

[5 of 5 positions shown; findings below may reference images not displayed]

ACR Breast Density Category b: There are scattered areas of
fibroglandular density.
FINDINGS: There are no findings suspicious for malignancy. Images were
processed with CAD.
IMPRESSION: No mammographic evidence of malignancy. A result letter of this
screening mammogram will be mailed directly to the patient.

RECOMMENDATION:
Screening mammogram in one year. (Code:AS-G-LCT)

BI-RADS CATEGORY  1: Negative.

## 2020-03-29 ENCOUNTER — Other Ambulatory Visit: Payer: Self-pay | Admitting: Family Medicine

## 2020-03-29 DIAGNOSIS — Z1231 Encounter for screening mammogram for malignant neoplasm of breast: Secondary | ICD-10-CM

## 2020-05-10 ENCOUNTER — Other Ambulatory Visit: Payer: Self-pay

## 2020-05-10 ENCOUNTER — Ambulatory Visit
Admission: RE | Admit: 2020-05-10 | Discharge: 2020-05-10 | Disposition: A | Discharge status: Home / Self Care | Payer: 59 | Source: Ambulatory Visit | Attending: Family Medicine | Admitting: Family Medicine

## 2020-05-10 DIAGNOSIS — Z1231 Encounter for screening mammogram for malignant neoplasm of breast: Secondary | ICD-10-CM

## 2020-11-09 NOTE — Progress Notes (Deleted)
Date:  11/09/2020   ID:  Chelsea Ross, DOB 1960/07/10, MRN [id number]  PCP:  Carol Ada, MD  Cardiologist: Rex Kras, DO, [facility] (established care 11/14/2020) Former Cardiology Providers: ***  REASON FOR CONSULT: Family history of heart disease  REQUESTING PHYSICIAN:  Carol Ada, MD [place],  [place] [id number]  No chief complaint on file.   HPI  Chelsea Ross is a 60 y.o. female who presents to the office with a chief complaint of "***." Patient's past medical history and cardiovascular risk factors include: ***  She is referred to the office at the request of Carol Ada, MD for evaluation of family history of heart disease.  ***  History of  Denies prior history of coronary artery disease, myocardial infarction, congestive heart failure, deep venous thrombosis, pulmonary embolism, stroke, transient ischemic attack.  FUNCTIONAL STATUS: ***   ALLERGIES: No Known Allergies  MEDICATION LIST PRIOR TO VISIT: No outpatient medications have been marked as taking for the 11/14/20 encounter (Appointment) with Rex Kras, DO.     PAST MEDICAL HISTORY: Past Medical History:  Diagnosis Date   Diabetes mellitus without complication ([place])    Hypertension    pt states she does not have hypertension   Renal disorder     PAST SURGICAL HISTORY: Past Surgical History:  Procedure Laterality Date   MASS EXCISION Left 05/12/2017   Procedure: EXCISION MASS LEFT RING FINGER;  Surgeon: Leanora Cover, MD;  Location: [place];  Service: Orthopedics;  Laterality: Left;    FAMILY HISTORY: The patient family history includes Breast cancer (age of onset: 80) in her mother; Cancer in her mother; Diabetes in her father; Hypertension in her father.  SOCIAL HISTORY:  The patient  reports that she has quit smoking. She has never used smokeless tobacco. She reports current alcohol use. She reports that she does not use drugs.  REVIEW OF  SYSTEMS: ROS  PHYSICAL EXAM: Vitals with BMI 05/12/2017 05/12/2017 05/12/2017  Height - - -  Weight - - -  BMI - - -  Systolic 341 962 229  Diastolic 84 82 79  Pulse 84 82 79    CONSTITUTIONAL: Well-developed and well-nourished. No acute distress.  SKIN: Skin is warm and dry. No rash noted. No cyanosis. No pallor. No jaundice HEAD: Normocephalic and atraumatic.  EYES: No scleral icterus MOUTH/THROAT: Moist oral membranes.  NECK: No JVD present. No thyromegaly noted. No carotid bruits  LYMPHATIC: No visible cervical adenopathy.  CHEST Normal respiratory effort. No intercostal retractions  LUNGS: *** No stridor. No wheezes. No rales.  CARDIOVASCULAR: *** ABDOMINAL: No apparent ascites.  EXTREMITIES: No peripheral edema  HEMATOLOGIC: No significant bruising NEUROLOGIC: Oriented to person, place, and time. Nonfocal. Normal muscle tone.  PSYCHIATRIC: Normal mood and affect. Normal behavior. Cooperative  CARDIAC DATABASE: EKG: ***  Echocardiogram: No results found for this or any previous visit from the past 1095 days.    Stress Testing: No results found for this or any previous visit from the past 1095 days.   Heart Catheterization: ***  LABORATORY DATA: CBC Latest Ref Rng & Units 05/12/2017 01/03/2017 12/17/2016  WBC 4.0 - 10.5 K/uL - 6.6 9.0  Hemoglobin 12.0 - 15.0 g/dL 17.0(H) 15.7(H) 16.4(H)  Hematocrit 36.0 - 46.0 % 50.0(H) 46.4(H) 47.5(H)  Platelets 150 - 400 K/uL - 341 219    CMP Latest Ref Rng & Units 05/12/2017 12/17/2016  Glucose 65 - 99 mg/dL 142(H) 181(H)  BUN 6 - 20 mg/dL  12 8  Creatinine 0.44 - 1.00 mg/dL 0.50 0.62  Sodium 135 - 145 mmol/L 137 136  Potassium 3.5 - 5.1 mmol/L 4.3 4.4  Chloride 101 - 111 mmol/L 101 99(L)  CO2 22 - 32 mmol/L - 24  Calcium 8.9 - 10.3 mg/dL - 9.0    Lipid Panel  No results found for: CHOL, TRIG, HDL, CHOLHDL, VLDL, LDLCALC, LDLDIRECT, LABVLDL  No components found for: NTPROBNP No results for input(s): PROBNP in the last 8760  hours. No results for input(s): TSH in the last 8760 hours.  BMP No results for input(s): NA, K, CL, CO2, GLUCOSE, BUN, CREATININE, CALCIUM, GFRNONAA, GFRAA in the last 8760 hours.  HEMOGLOBIN A1C No results found for: HGBA1C, MPG  External Labs:  Date Collected: 04/27/2019 , information obtained by *** Potassium: 4.3 Creatinine 0.64 mg/dL. eGFR: 98 mL/min per 1.73 m Hemoglobin: *** g/dL and hematocrit: *** % Lipid profile: Total cholesterol *** , triglycerides *** , HDL *** , LDL *** AST: 32 , ALT: 23 , alkaline phosphatase: 55  TSH: ***    Date Collected: 05/27/2019 , information obtained by *** Hemoglobin A1c: 8.6   IMPRESSION:  No diagnosis found.   RECOMMENDATIONS: [name] is a 60 y.o. female whose past medical history and cardiac risk factors include: ***   FINAL MEDICATION LIST END OF ENCOUNTER: No orders of the defined types were placed in this encounter.   There are no discontinued medications.   Current Outpatient Medications:    Collagen 500 MG CAPS, Take 1,000 mg by mouth., Disp: , Rfl:    HYDROcodone-acetaminophen (NORCO) 5-325 MG tablet, 1-2 tabs po q6 hours prn pain, Disp: 20 tablet, Rfl: 0   losartan (COZAAR) 50 MG tablet, Take 100 mg by mouth every morning. , Disp: , Rfl:    magnesium oxide (MAG-OX) 400 MG tablet, Take 800-1,200 mg by mouth daily., Disp: , Rfl:    metFORMIN (GLUCOPHAGE-XR) 500 MG 24 hr tablet, Take 500 mg by mouth 2 (two) times daily., Disp: , Rfl:    Multiple Vitamins-Minerals (HAIR/SKIN/NAILS/BIOTIN PO), Take 1,300 mg by mouth daily., Disp: , Rfl:    zinc gluconate 50 MG tablet, Take 50 mg by mouth daily., Disp: , Rfl:   No orders of the defined types were placed in this encounter.   There are no Patient Instructions on file for this visit.   --Continue cardiac medications as reconciled in final medication list. --No follow-ups on file. Or sooner if needed. --Continue follow-up with your primary care physician regarding the  management of your other chronic comorbid conditions.  Patient's questions and concerns were addressed to her satisfaction. She voices understanding of the instructions provided during this encounter.   This note was created using a voice recognition software as a result there may be grammatical errors inadvertently enclosed that do not reflect the nature of this encounter. Every attempt is made to correct such errors.  Rex Kras, [place], [facility]  Pager: [phone] Office: [phone]

## 2020-11-14 ENCOUNTER — Ambulatory Visit: Payer: 59 | Admitting: Cardiology

## 2020-11-21 ENCOUNTER — Encounter: Payer: Self-pay | Admitting: Cardiology

## 2020-11-21 ENCOUNTER — Other Ambulatory Visit: Payer: Self-pay

## 2020-11-21 ENCOUNTER — Ambulatory Visit: Payer: 59 | Admitting: Cardiology

## 2020-11-21 VITALS — BP 115/74 | HR 99 | Temp 96.7°F | Resp 16 | Ht 65.0 in | Wt 113.0 lb

## 2020-11-21 DIAGNOSIS — E119 Type 2 diabetes mellitus without complications: Secondary | ICD-10-CM

## 2020-11-21 DIAGNOSIS — R9431 Abnormal electrocardiogram [ECG] [EKG]: Secondary | ICD-10-CM

## 2020-11-21 DIAGNOSIS — Z87891 Personal history of nicotine dependence: Secondary | ICD-10-CM

## 2020-11-21 DIAGNOSIS — F1010 Alcohol abuse, uncomplicated: Secondary | ICD-10-CM

## 2020-11-21 DIAGNOSIS — Z8249 Family history of ischemic heart disease and other diseases of the circulatory system: Secondary | ICD-10-CM

## 2020-11-21 DIAGNOSIS — R6889 Other general symptoms and signs: Secondary | ICD-10-CM

## 2020-11-21 DIAGNOSIS — I10 Essential (primary) hypertension: Secondary | ICD-10-CM

## 2020-11-21 NOTE — Progress Notes (Signed)
Date:  11/21/2020   ID:  Chelsea Ross, DOB 01/30/1961, MRN [id number]  PCP:  Carol Ada, MD  Cardiologist:  Rex Kras, DO, [facility] (established care 11/21/20)  REASON FOR CONSULT: Family history of heart disease.  REQUESTING PHYSICIAN:  Self referral  Chief Complaint  Patient presents with   Family history of heart disease    New Patient (Initial Visit)    Decreased physical endurance    HPI  Chelsea Ross is a 60 y.o. female who presents to the office with a chief complaint of " decreased physical endurance and family history of heart disease." Patient's past medical history and cardiovascular risk factors include: Diabetes mellitus type 2, hypertension, heartburn, former smoker, membranous glomerulonephritis, excessive alcohol use,  postmenopausal female.  Patient is self-referred to the practice for evaluation of heart disease given her symptoms of decreased physical endurance and family history.  Patient denies any chest pain or shortness of breath at rest or with effort related activities; however, has started to notice a decrease in her overall functional status.  Patient states that prior to the COVID-19 pandemic he would go to the gym 4-5 times per week and now simple ambulating or walking up a hill she feels tired and fatigued.  No family history of premature coronary artery disease or sudden cardiac death.  Patient also informs me that in February and August 2022 she has had episodes of slurred speech, tingling which have short-lived and usually resolved after eating sugary foods and the thought process was she may be hypoglycemic given her underlying diabetes.  However, she did not go to the hospital for further evaluation. And currently does not follow-up with neurology.  Patient consumes alcohol on a daily basis up to 4 drinks per day.  At times it may be more depending on the weekend or if she is out [place].  FUNCTIONAL STATUS: No structured exercise program or  daily routine.   ALLERGIES: No Known Allergies  MEDICATION LIST PRIOR TO VISIT: Current Meds  Medication Sig   glipiZIDE (GLUCOTROL) 5 MG tablet Take 5 mg by mouth daily.   linagliptin (TRADJENTA) 5 MG TABS tablet Take 1 tablet by mouth daily.   lipase/protease/amylase (CREON) 36000 UNITS CPEP capsule Take by mouth.   losartan (COZAAR) 25 MG tablet Take 25 mg by mouth every morning.   magnesium oxide (MAG-OX) 400 MG tablet Take 800-1,200 mg by mouth daily.   metFORMIN (GLUCOPHAGE-XR) 500 MG 24 hr tablet Take 500 mg by mouth 2 (two) times daily.   OneTouch Delica Lancets 34K MISC 1 kit by [employer].(Non-Drug; Combo Route) route daily.     PAST MEDICAL HISTORY: Past Medical History:  Diagnosis Date   Diabetes mellitus without complication ([place])    GERD (gastroesophageal reflux disease)    Hypertension    pt states she does not have hypertension   Renal disorder     PAST SURGICAL HISTORY: Past Surgical History:  Procedure Laterality Date   MASS EXCISION Left 05/12/2017   Procedure: EXCISION MASS LEFT RING FINGER;  Surgeon: Leanora Cover, MD;  Location: [place];  Service: Orthopedics;  Laterality: Left;    FAMILY HISTORY: The patient family history includes Breast cancer (age of onset: 85) in her mother; Cancer in her mother; Diabetes in her father; Hypertension in her father.  SOCIAL HISTORY:  The patient  reports that she has quit smoking. Her smoking use included cigarettes. She has a 60.00 pack-year smoking history. She has never used smokeless tobacco. She  reports current alcohol use. She reports that she does not use drugs.  REVIEW OF SYSTEMS: Review of Systems  Constitutional: Negative for chills and fever.  HENT:  Negative for hoarse voice and nosebleeds.   Eyes:  Negative for discharge, double vision and pain.  Cardiovascular:  Negative for chest pain, claudication, dyspnea on exertion, leg swelling, near-syncope, orthopnea, palpitations, paroxysmal  nocturnal dyspnea and syncope.  Respiratory:  Negative for hemoptysis and shortness of breath.   Musculoskeletal:  Negative for muscle cramps and myalgias.  Gastrointestinal:  Negative for abdominal pain, constipation, diarrhea, hematemesis, hematochezia, melena, nausea and vomiting.  Neurological:  Positive for dizziness and light-headedness. Negative for headaches.   PHYSICAL EXAM: Vitals with BMI 11/21/2020 05/12/2017 05/12/2017  Height 5' 5"  - -  Weight 113 lbs - -  BMI 34.7 - -  Systolic 425 956 387  Diastolic 74 84 82  Pulse 99 84 82    CONSTITUTIONAL: Well-developed and well-nourished. No acute distress.  SKIN: Skin is warm and dry. No rash noted. No cyanosis. No pallor. No jaundice HEAD: Normocephalic and atraumatic.  EYES: No scleral icterus MOUTH/THROAT: Moist oral membranes.  NECK: No JVD present. No thyromegaly noted. No carotid bruits  LYMPHATIC: No visible cervical adenopathy.  CHEST Normal respiratory effort. No intercostal retractions  LUNGS: Clear to auscultation bilaterally. No stridor. No wheezes. No rales.  CARDIOVASCULAR: Regular rate and rhythm, positive S1-S2, no murmurs rubs or gallops appreciated.  ABDOMINAL: Nonobese, soft, nontender, nondistended, positive bowel sounds in all 4 quadrants, no apparent ascites.  EXTREMITIES: No peripheral edema, warm to touch bilaterally, 2+ DP pulses bilaterally. HEMATOLOGIC: No significant bruising NEUROLOGIC: Oriented to person, place, and time. Nonfocal. Normal muscle tone.  PSYCHIATRIC: Normal mood and affect. Normal behavior. Cooperative  CARDIAC DATABASE: EKG: 11/21/2020: Normal sinus rhythm, 79 bpm, normal axis, nonspecific ST-T changes, without underlying injury pattern.  Echocardiogram: No results found for this or any previous visit from the past 1095 days.    Stress Testing: No results found for this or any previous visit from the past 1095 days.   Heart Catheterization: None  LABORATORY DATA: CBC Latest  Ref Rng & Units 05/12/2017 01/03/2017 12/17/2016  WBC 4.0 - 10.5 K/uL - 6.6 9.0  Hemoglobin 12.0 - 15.0 g/dL 17.0(H) 15.7(H) 16.4(H)  Hematocrit 36.0 - 46.0 % 50.0(H) 46.4(H) 47.5(H)  Platelets 150 - 400 K/uL - 341 219    CMP Latest Ref Rng & Units 05/12/2017 12/17/2016  Glucose 65 - 99 mg/dL 142(H) 181(H)  BUN 6 - 20 mg/dL 12 8  Creatinine 0.44 - 1.00 mg/dL 0.50 0.62  Sodium 135 - 145 mmol/L 137 136  Potassium 3.5 - 5.1 mmol/L 4.3 4.4  Chloride 101 - 111 mmol/L 101 99(L)  CO2 22 - 32 mmol/L - 24  Calcium 8.9 - 10.3 mg/dL - 9.0    Lipid Panel  No results found for: CHOL, TRIG, HDL, CHOLHDL, VLDL, LDLCALC, LDLDIRECT, LABVLDL  No components found for: NTPROBNP No results for input(s): PROBNP in the last 8760 hours. No results for input(s): TSH in the last 8760 hours.  BMP No results for input(s): NA, K, CL, CO2, GLUCOSE, BUN, CREATININE, CALCIUM, GFRNONAA, GFRAA in the last 8760 hours.  05/25/2020: Glucose 209, BUN/Cr 9/0.64. EGFR 98. Na/K 139/4.3. Rest of the CMP normal HbA1C 8.6%  HEMOGLOBIN A1C No results found for: HGBA1C, MPG  IMPRESSION:    ICD-10-CM   1. Worsening functional endurance  R68.89 PCV ECHOCARDIOGRAM COMPLETE    2. Family history of heart disease  Z82.49 EKG 12-Lead    CT CARDIAC SCORING ([facility] LOCATIONS ONLY)    3. Nonspecific abnormal electrocardiogram (ECG) (EKG)  R94.31     4. Type 2 diabetes mellitus without complication, without long-term current use of insulin (HCC)  E11.9 CT CARDIAC SCORING ([facility] LOCATIONS ONLY)    5. Benign hypertension  I10 PCV ECHOCARDIOGRAM COMPLETE    6. Excessive drinking alcohol  F10.10     7. Former smoker  Z87.891        RECOMMENDATIONS: ELISSA GRIESHOP is a 60 y.o. female whose past medical history and cardiac risk factors include: Diabetes mellitus type 2, hypertension, heartburn, former smoker, membranous glomerulonephritis, excessive alcohol use,  postmenopausal female.  Patient has self-referred her to our practice  for further evaluation and management of her worsening physical endurance.  Given her risk factors including hypertension, diabetes, and excessive alcohol use recommend echocardiogram to evaluate for structural heart disease and a coronary calcium score for further risk stratification.  Based on results of her coronary calcium score she may benefit from stress testing as well.  EKG in office shows normal sinus mechanism without underlying injury pattern but is found to have nonspecific ST-T changes.  Patient is educated on the importance of reducing her alcohol consumption.  No more than 1 alcoholic beverage per day.  Recently she is been experiencing episodes of slurred speech, tingling and I recommended that she see neurology for further evaluation and management.  Further recommendations to follow as the case evolves.  As a part of this consultation independently reviewed office notes, lab work, and EKG provided by the patient's primary team.   FINAL MEDICATION LIST END OF ENCOUNTER: No orders of the defined types were placed in this encounter.   Medications Discontinued During This Encounter  Medication Reason   Collagen 500 MG CAPS Error   HYDROcodone-acetaminophen (NORCO) 5-325 MG tablet Error   Multiple Vitamins-Minerals (HAIR/SKIN/NAILS/BIOTIN PO) Error   zinc gluconate 50 MG tablet Error     Current Outpatient Medications:    glipiZIDE (GLUCOTROL) 5 MG tablet, Take 5 mg by mouth daily., Disp: , Rfl:    linagliptin (TRADJENTA) 5 MG TABS tablet, Take 1 tablet by mouth daily., Disp: , Rfl:    lipase/protease/amylase (CREON) 36000 UNITS CPEP capsule, Take by mouth., Disp: , Rfl:    losartan (COZAAR) 25 MG tablet, Take 25 mg by mouth every morning., Disp: , Rfl:    magnesium oxide (MAG-OX) 400 MG tablet, Take 800-1,200 mg by mouth daily., Disp: , Rfl:    metFORMIN (GLUCOPHAGE-XR) 500 MG 24 hr tablet, Take 500 mg by mouth 2 (two) times daily., Disp: , Rfl:    OneTouch Delica Lancets  24Q MISC, 1 kit by [employer].(Non-Drug; Combo Route) route daily., Disp: , Rfl:   Orders Placed This Encounter  Procedures   CT CARDIAC SCORING ([facility] LOCATIONS ONLY)   EKG 12-Lead   PCV ECHOCARDIOGRAM COMPLETE    There are no Patient Instructions on file for this visit.   --Continue cardiac medications as reconciled in final medication list. --Return in about 3 weeks (around 12/12/2020) for Follow up decrease functional capacity, review test results. Or sooner if needed. --Continue follow-up with your primary care physician regarding the management of your other chronic comorbid conditions.  Patient's questions and concerns were addressed to her satisfaction. She voices understanding of the instructions provided during this encounter.   This note was created using a voice recognition software as a result there may be grammatical errors inadvertently enclosed that do  not reflect the nature of this encounter. Every attempt is made to correct such errors.  Rex Kras, [place], [facility]  Pager: [phone] Office: [phone]

## 2020-12-06 ENCOUNTER — Ambulatory Visit: Payer: 59

## 2020-12-06 ENCOUNTER — Other Ambulatory Visit: Payer: Self-pay

## 2020-12-06 DIAGNOSIS — I10 Essential (primary) hypertension: Secondary | ICD-10-CM

## 2020-12-06 DIAGNOSIS — R6889 Other general symptoms and signs: Secondary | ICD-10-CM

## 2020-12-11 ENCOUNTER — Ambulatory Visit
Admission: RE | Admit: 2020-12-11 | Discharge: 2020-12-11 | Disposition: A | Discharge status: Home / Self Care | Payer: No Typology Code available for payment source | Source: Ambulatory Visit | Attending: Cardiology | Admitting: Cardiology

## 2020-12-11 DIAGNOSIS — E119 Type 2 diabetes mellitus without complications: Secondary | ICD-10-CM

## 2020-12-11 DIAGNOSIS — Z8249 Family history of ischemic heart disease and other diseases of the circulatory system: Secondary | ICD-10-CM

## 2020-12-12 NOTE — Progress Notes (Signed)
Called and spoke to pt, pt had some concerns regarding her cardiac scoring results. I am not seeing a result note for this, she would like to speak to you directly when you have a chance.

## 2020-12-12 NOTE — Progress Notes (Signed)
I spoke to patient earlier regarding calcium scoring regarding the findings patient what's to know what a pulmonary nodule is and does she need to be concern I told patient I will forward results to PCP but she insisted in wanting a more clear explanation

## 2020-12-26 ENCOUNTER — Other Ambulatory Visit: Payer: Self-pay

## 2020-12-26 ENCOUNTER — Encounter: Payer: Self-pay | Admitting: Student

## 2020-12-26 ENCOUNTER — Ambulatory Visit: Payer: 59 | Admitting: Cardiology

## 2020-12-26 ENCOUNTER — Ambulatory Visit: Payer: 59 | Admitting: Student

## 2020-12-26 VITALS — BP 127/74 | HR 83 | Temp 97.9°F | Ht 65.0 in | Wt 112.0 lb

## 2020-12-26 DIAGNOSIS — R6889 Other general symptoms and signs: Secondary | ICD-10-CM

## 2020-12-26 DIAGNOSIS — R931 Abnormal findings on diagnostic imaging of heart and coronary circulation: Secondary | ICD-10-CM

## 2020-12-26 DIAGNOSIS — I10 Essential (primary) hypertension: Secondary | ICD-10-CM

## 2020-12-26 MED ORDER — METOPROLOL TARTRATE 25 MG PO TABS: ORAL_TABLET | ORAL | 0 refills | Status: DC

## 2020-12-26 NOTE — Progress Notes (Signed)
Date:  11/21/2020   ID:  [name], DOB 06-Mar-1961, MRN [id number]  PCP:  Carol Ada, MD  Cardiologist:  Alethia Berthold, DO, [facility] (established care 11/21/20)  REASON FOR CONSULT: Family history of heart disease.  REQUESTING PHYSICIAN:  Self referral  Chief Complaint  Patient presents with   Worsening functional endurance   Follow-up   Results    HPI  Chelsea Ross is a 60 y.o. female who presents to the office with a chief complaint of " decreased physical endurance and family history of heart disease." Patient's past medical history and cardiovascular risk factors include: Diabetes mellitus type 2, hypertension, heartburn, former smoker, membranous glomerulonephritis, excessive alcohol use,  postmenopausal female.  Patient is self-referred to the practice for evaluation of heart disease given her symptoms of decreased physical endurance and family history.  Last office visit ordered echocardiogram, coronary calcium score.  Patient's total Agaston score of 251 places her in the 95th percentile.  Echocardiogram revealed LVEF of 55% and no significant abnormalities.  Patient now presents here for follow-up.  Reviewed and discussed results of echocardiogram and coronary calcium scoring.  Patient's CT scan revealed multiple lung nodules, for which she has already discussed with her PCP and has appointment to see pulmonology for further evaluation and management.  Patient continues to complain of decreased exercise tolerance and fatigue.  Patient consumes up to 4 alcoholic drinks per day.   FUNCTIONAL STATUS: No structured exercise program or daily routine.   ALLERGIES: No Known Allergies  MEDICATION LIST PRIOR TO VISIT: Current Meds  Medication Sig   glipiZIDE (GLUCOTROL) 5 MG tablet Take 5 mg by mouth daily.   linagliptin (TRADJENTA) 5 MG TABS tablet Take 1 tablet by mouth daily.   lipase/protease/amylase (CREON) 36000 UNITS CPEP capsule Take by mouth.   losartan  (COZAAR) 25 MG tablet Take 25 mg by mouth every morning.   magnesium oxide (MAG-OX) 400 MG tablet Take 800-1,200 mg by mouth daily.   metFORMIN (GLUCOPHAGE-XR) 500 MG 24 hr tablet Take 500 mg by mouth 2 (two) times daily.   metoprolol tartrate (LOPRESSOR) 25 MG tablet Take one tablet twice daily for 3 days prior to CTA, then one tablet the morning of CTA.   OneTouch Delica Lancets 29V MISC 1 kit by [employer].(Non-Drug; Combo Route) route daily.     PAST MEDICAL HISTORY: Past Medical History:  Diagnosis Date   Diabetes mellitus without complication ([place])    GERD (gastroesophageal reflux disease)    Hypertension    pt states she does not have hypertension   Renal disorder     PAST SURGICAL HISTORY: Past Surgical History:  Procedure Laterality Date   MASS EXCISION Left 05/12/2017   Procedure: EXCISION MASS LEFT RING FINGER;  Surgeon: Leanora Cover, MD;  Location: [place];  Service: Orthopedics;  Laterality: Left;    FAMILY HISTORY: The patient family history includes Breast cancer (age of onset: 78) in her mother; Cancer in her mother; Diabetes in her father; Hypertension in her father.  SOCIAL HISTORY:  The patient  reports that she has quit smoking. Her smoking use included cigarettes. She has a 60.00 pack-year smoking history. She has never used smokeless tobacco. She reports current alcohol use. She reports that she does not use drugs.  REVIEW OF SYSTEMS: Review of Systems  Constitutional: Positive for malaise/fatigue. Negative for chills and fever.  HENT:  Negative for hoarse voice and nosebleeds.   Eyes:  Negative for discharge, double vision and pain.  Cardiovascular:  Negative for chest pain, claudication, dyspnea on exertion, leg swelling, near-syncope, orthopnea, palpitations, paroxysmal nocturnal dyspnea and syncope.  Respiratory:  Negative for hemoptysis and shortness of breath.   Musculoskeletal:  Negative for muscle cramps and myalgias.  Gastrointestinal:   Negative for abdominal pain, constipation, diarrhea, hematemesis, hematochezia, melena, nausea and vomiting.  Neurological:  Positive for dizziness and light-headedness. Negative for headaches.   PHYSICAL EXAM: Vitals with BMI 12/26/2020 11/21/2020 05/12/2017  Height _0  _1  -  Weight 112 lbs 113 lbs -  BMI 24.82 50.0 -  Systolic 370 488 891  Diastolic 74 74 84  Pulse 83 99 84   CONSTITUTIONAL: Well-developed and well-nourished. No acute distress.  SKIN: Skin is warm and dry. No rash noted. No cyanosis. No pallor. No jaundice HEAD: Normocephalic and atraumatic.  EYES: No scleral icterus MOUTH/THROAT: Moist oral membranes.  NECK: No JVD present. No thyromegaly noted. No carotid bruits  LYMPHATIC: No visible cervical adenopathy.  CHEST Normal respiratory effort. No intercostal retractions  LUNGS: Clear to auscultation bilaterally. No stridor. No wheezes. No rales.  CARDIOVASCULAR: Regular rate and rhythm, positive S1-S2, no murmurs rubs or gallops appreciated.  ABDOMINAL: Nonobese, soft, nontender, nondistended, positive bowel sounds in all 4 quadrants, no apparent ascites.  EXTREMITIES: No peripheral edema, warm to touch bilaterally, 2+ DP pulses bilaterally. HEMATOLOGIC: No significant bruising NEUROLOGIC: Oriented to person, place, and time. Nonfocal. Normal muscle tone.  PSYCHIATRIC: Normal mood and affect. Normal behavior. Cooperative Physical exam unchanged compared to previous visit  CARDIAC DATABASE: EKG: 11/21/2020: Normal sinus rhythm, 79 bpm, normal axis, nonspecific ST-T changes, without underlying injury pattern.  Echocardiogram: 12/06/2020: Left ventricle cavity is normal in size and wall thickness. Normal global wall motion. Normal LV systolic function with EF 55%. Indeterminate diastolic filling pattern. Trileaflet aortic valve.  Mild (Grade I) aortic regurgitation. Normal right atrial pressure.   Stress Testing: No results found for this or any previous visit  from the past 1095 days.  Coronary calcium score 12/11/2020: Left Main: 0 LAD: 97.4 LCx: 0 RCA: 154 Total Agatston Score: 251 MESA database percentile: 95  Multiple pulmonary nodules. Most severe: 6 mm right solid pulmonary nodule. Recommend a non-contrast Chest CT at 3-6 months. If patient is high risk for malignancy, recommend an additional non-contrast Chest CT at 18-24 months; if patient is low risk for malignancy a non-contrast Chest CT at 18-24 months is optional. Probable hepatic steatosis. Aortic Atherosclerosis (ICD10-I70.0).  Heart Catheterization: None  LABORATORY DATA: CBC Latest Ref Rng & Units 05/12/2017 01/03/2017 12/17/2016  WBC 4.0 - 10.5 K/uL - 6.6 9.0  Hemoglobin 12.0 - 15.0 g/dL 17.0(H) 15.7(H) 16.4(H)  Hematocrit 36.0 - 46.0 % 50.0(H) 46.4(H) 47.5(H)  Platelets 150 - 400 K/uL - 341 219    CMP Latest Ref Rng & Units 05/12/2017 12/17/2016  Glucose 65 - 99 mg/dL 142(H) 181(H)  BUN 6 - 20 mg/dL 12 8  Creatinine 0.44 - 1.00 mg/dL 0.50 0.62  Sodium 135 - 145 mmol/L 137 136  Potassium 3.5 - 5.1 mmol/L 4.3 4.4  Chloride 101 - 111 mmol/L 101 99(L)  CO2 22 - 32 mmol/L - 24  Calcium 8.9 - 10.3 mg/dL - 9.0    Lipid Panel  No results found for: CHOL, TRIG, HDL, CHOLHDL, VLDL, LDLCALC, LDLDIRECT, LABVLDL  No components found for: NTPROBNP No results for input(s): PROBNP in the last 8760 hours. No results for input(s): TSH in the last 8760 hours.  BMP No results for input(s): NA, K, CL, CO2,  GLUCOSE, BUN, CREATININE, CALCIUM, GFRNONAA, GFRAA in the last 8760 hours.  05/25/2020: Glucose 209, BUN/Cr 9/0.64. EGFR 98. Na/K 139/4.3. Rest of the CMP normal HbA1C 8.6%  HEMOGLOBIN A1C No results found for: HGBA1C, MPG  IMPRESSION:    ICD-10-CM   1. Worsening functional endurance  R68.89     2. Benign hypertension  Z61 Basic metabolic panel    3. Elevated coronary artery calcium score  R93.1 Lipid Panel With LDL/HDL Ratio    Basic metabolic panel    CT CORONARY  MORPH W/CTA COR W/SCORE W/CA W/CM &/OR WO/CM       RECOMMENDATIONS: Chelsea Ross is a 60 y.o. female whose past medical history and cardiac risk factors include: Diabetes mellitus type 2, hypertension, heartburn, former smoker, membranous glomerulonephritis, excessive alcohol use,  postmenopausal female.  Given patient's elevated coronary calcium score and symptoms of reduced exercise tolerance and fatigue recommend further ischemic evaluation.  Discussed with patient option of stress test versus coronary CTA.  Discussed risks versus benefits of both and shared decision was to proceed with coronary CTA.  Patient will take metoprolol for 3 days prior to imaging, she verbalized understanding of dosing.  Also given patient's history of diabetes and coronary calcification recommend statin therapy.  However patient is resistant to additional medications at this time.  We will therefore obtain lipid profile testing.  We will revisit discussion of statin therapy at next office visit.  Patient's blood pressure is well controlled.  Again reiterated the importance of reducing her alcohol consumption.  FINAL MEDICATION LIST END OF ENCOUNTER: Meds ordered this encounter  Medications   metoprolol tartrate (LOPRESSOR) 25 MG tablet    Sig: Take one tablet twice daily for 3 days prior to CTA, then one tablet the morning of CTA.    Dispense:  7 tablet    Refill:  0     There are no discontinued medications.    Current Outpatient Medications:    glipiZIDE (GLUCOTROL) 5 MG tablet, Take 5 mg by mouth daily., Disp: , Rfl:    linagliptin (TRADJENTA) 5 MG TABS tablet, Take 1 tablet by mouth daily., Disp: , Rfl:    lipase/protease/amylase (CREON) 36000 UNITS CPEP capsule, Take by mouth., Disp: , Rfl:    losartan (COZAAR) 25 MG tablet, Take 25 mg by mouth every morning., Disp: , Rfl:    magnesium oxide (MAG-OX) 400 MG tablet, Take 800-1,200 mg by mouth daily., Disp: , Rfl:    metFORMIN (GLUCOPHAGE-XR) 500 MG  24 hr tablet, Take 500 mg by mouth 2 (two) times daily., Disp: , Rfl:    metoprolol tartrate (LOPRESSOR) 25 MG tablet, Take one tablet twice daily for 3 days prior to CTA, then one tablet the morning of CTA., Disp: 7 tablet, Rfl: 0   OneTouch Delica Lancets 09U MISC, 1 kit by Misc.(Non-Drug; Combo Route) route daily., Disp: , Rfl:   Orders Placed This Encounter  Procedures   CT CORONARY MORPH W/CTA COR W/SCORE W/CA W/CM &/OR WO/CM   Lipid Panel With LDL/HDL Ratio   Basic metabolic panel    There are no Patient Instructions on file for this visit.   --Continue cardiac medications as reconciled in final medication list. --Return in about 8 weeks (around 02/20/2021) for cardiac testing results . Or sooner if needed. --Continue follow-up with your primary care physician regarding the management of your other chronic comorbid conditions.  Patient's questions and concerns were addressed to her satisfaction. She voices understanding of the instructions provided during this encounter.  This note was created using a voice recognition software as a result there may be grammatical errors inadvertently enclosed that do not reflect the nature of this encounter. Every attempt is made to correct such errors.   Alethia Berthold, PA-C 12/26/2020, 10:19 AM Office: [phone]

## 2021-01-03 LAB — BASIC METABOLIC PANEL
BUN/Creatinine Ratio: 9 — ABNORMAL LOW (ref 12–28)
BUN: 8 mg/dL (ref 8–27)
CO2: 22 mmol/L (ref 20–29)
Calcium: 8.9 mg/dL (ref 8.7–10.3)
Chloride: 93 mmol/L — ABNORMAL LOW (ref 96–106)
Creatinine, Ser: 0.88 mg/dL (ref 0.57–1.00)
Glucose: 145 mg/dL — ABNORMAL HIGH (ref 70–99)
Sodium: 139 mmol/L (ref 134–144)
eGFR: 75 mL/min/{1.73_m2} (ref >59)

## 2021-01-03 LAB — LIPID PANEL WITH LDL/HDL RATIO
Cholesterol, Total: 153 mg/dL (ref 100–199)
HDL: 74 mg/dL (ref >39)
LDL Chol Calc (NIH): 61 mg/dL (ref 0–99)
LDL/HDL Ratio: 0.8 ratio (ref 0.0–3.2)
Triglycerides: 98 mg/dL (ref 0–149)
VLDL Cholesterol Cal: 18 mg/dL (ref 5–40)

## 2021-01-10 ENCOUNTER — Other Ambulatory Visit: Payer: Self-pay

## 2021-01-10 ENCOUNTER — Ambulatory Visit (INDEPENDENT_AMBULATORY_CARE_PROVIDER_SITE_OTHER): Payer: 59 | Admitting: Emergency Medicine

## 2021-01-10 ENCOUNTER — Encounter: Payer: Self-pay | Admitting: Emergency Medicine

## 2021-01-10 VITALS — BP 102/64 | HR 80 | Temp 98.1°F | Ht 65.0 in | Wt 113.8 lb

## 2021-01-10 DIAGNOSIS — R0609 Other forms of dyspnea: Secondary | ICD-10-CM | POA: Diagnosis not present

## 2021-01-10 DIAGNOSIS — R918 Other nonspecific abnormal finding of lung field: Secondary | ICD-10-CM | POA: Diagnosis not present

## 2021-01-10 NOTE — Assessment & Plan Note (Signed)
Cardiac evaluation is ongoing.  She is suspicious that some of her exertional dyspnea is related to deconditioning.  We discussed possibly doing pulmonary function testing given her tobacco history.  She wants to defer for now, try to work on her exercise routine and increase her stamina.  If this is ineffective we should reconsider PFT to evaluate for COPD.

## 2021-01-10 NOTE — Patient Instructions (Signed)
We will repeat your CT scan of the chest in 6 months. We will hold off on pulmonary function testing for now. Follow Dr. Lamonte Sakai in 6 months after your CT so we can review the results together.

## 2021-01-10 NOTE — Assessment & Plan Note (Signed)
Small pulmonary nodules, largest 6 mm noted on coronary CT and patient with increased lung cancer risk due to tobacco history.  We will plan to repeat her CT chest in 6 months, follow-up to review.  Plan diagnostics if indicated.  If the nodules are stable and we decide that her next interval scan would need to be in 12 months, then we could consider transitioning over to the lung cancer screening program to follow these nodules.  We can discuss next time.

## 2021-01-10 NOTE — Progress Notes (Signed)
Subjective:    Patient ID: Chelsea Ross, female    DOB: November 07, 1960, 60 y.o.   MRN: [id number]  HPI 60 year old former smoker (60 pack years) with a history of diabetes, GERD, hypertension, membranous glomerulonephritis.  She has been under evaluation for decreased functional capacity and exertional tolerance with [facility].  Part of her evaluation has included a cardiac CT and she is here to discuss pulmonary nodules. She does have some exertional SOB, noticed it when she tried to get back to exercise a month ago (after missing 5 months). Has dyspnea when hiking. No cough. No wheeze.   CT cardiac calcium scoring 12/11/2020 reviewed by me, showed no apparent mediastinal adenopathy.  There was a 6 mm right middle lobe nodule, 4 mm right lower lobe nodule, 5 mm left upper lobe nodule noted.   Review of Systems As per HPI  Past Medical History:  Diagnosis Date   Diabetes mellitus without complication (HCC)    GERD (gastroesophageal reflux disease)    Hypertension    pt states she does not have hypertension   Renal disorder      Family History  Problem Relation Age of Onset   Cancer Mother    Breast cancer Mother 32       mother had breast cancer 3 times   Hypertension Father    Diabetes Father     No lung cancer in the family  Social History   Socioeconomic History   Marital status: Single    Spouse name: Not on file   Number of children: Not on file   Years of education: Not on file   Highest education level: Not on file  Occupational History   Not on file  Tobacco Use   Smoking status: Former    Packs/day: 2.00    Years: 30.00    Pack years: 60.00    Types: Cigarettes   Smokeless tobacco: Never  Vaping Use   Vaping Use: Never used  Substance and Sexual Activity   Alcohol use: Yes    Comment: daily 4 drinks   Drug use: No   Sexual activity: Not on file  Other Topics Concern   Not on file  Social History Narrative   Not on file   Social  Determinants of Health   Financial Resource Strain: Not on file  Food Insecurity: Not on file  Transportation Needs: Not on file  Physical Activity: Not on file  Stress: Not on file  Social Connections: Not on file  Intimate Partner Violence: Not on file    From [facility], has lived there and [place] Has worked at [employer], all office work.  Has owned dogs, briefly owned a bird as a teen. Has owned turtles.   No Known Allergies   Outpatient Medications Prior to Visit  Medication Sig Dispense Refill   glipiZIDE (GLUCOTROL) 5 MG tablet Take 5 mg by mouth daily.     linagliptin (TRADJENTA) 5 MG TABS tablet Take 1 tablet by mouth daily.     lipase/protease/amylase (CREON) 36000 UNITS CPEP capsule Take by mouth.     losartan (COZAAR) 25 MG tablet Take 25 mg by mouth every morning.     magnesium oxide (MAG-OX) 400 MG tablet Take 800-1,200 mg by mouth daily.     metFORMIN (GLUCOPHAGE-XR) 500 MG 24 hr tablet Take 500 mg by mouth 2 (two) times daily.     metoprolol tartrate (LOPRESSOR) 25 MG tablet Take one tablet twice daily for 3 days prior to CTA,  then one tablet the morning of CTA. 7 tablet 0   OneTouch Delica Lancets 41R MISC 1 kit by Misc.(Non-Drug; Combo Route) route daily.     No facility-administered medications prior to visit.        Objective:   Physical Exam  Vitals:   01/10/21 0935  BP: 102/64  Pulse: 80  Temp: 98.1 F (36.7 C)  TempSrc: Oral  SpO2: 99%  Weight: 113 lb 12.8 oz (51.6 kg)  Height: 5' 5"  (1.651 m)   Gen: Pleasant, well-nourished, in no distress,  normal affect  ENT: No lesions,  mouth clear,  oropharynx clear, no postnasal drip  Neck: No JVD, no stridor  Lungs: No use of accessory muscles, no crackles or wheezing on normal respiration, no wheeze on forced expiration  Cardiovascular: RRR, heart sounds normal, no murmur or gallops, no peripheral edema  Musculoskeletal: No deformities, no cyanosis or clubbing  Neuro: alert, awake, non focal  Skin: Warm, no  lesions or rash     Assessment & Plan:  Pulmonary nodules/lesions, multiple Small pulmonary nodules, largest 6 mm noted on coronary CT and patient with increased lung cancer risk due to tobacco history.  We will plan to repeat her CT chest in 6 months, follow-up to review.  Plan diagnostics if indicated.  If the nodules are stable and we decide that her next interval scan would need to be in 12 months, then we could consider transitioning over to the lung cancer screening program to follow these nodules.  We can discuss next time.  Dyspnea on exertion Cardiac evaluation is ongoing.  She is suspicious that some of her exertional dyspnea is related to deconditioning.  We discussed possibly doing pulmonary function testing given her tobacco history.  She wants to defer for now, try to work on her exercise routine and increase her stamina.  If this is ineffective we should reconsider PFT to evaluate for COPD.   Baltazar Apo, MD, PhD 01/10/2021, 10:13 AM [facility] [phone] or if no answer before 7:00PM call [phone] For any issues after 7:00PM please call eLink [phone]

## 2021-01-16 ENCOUNTER — Telehealth (HOSPITAL_COMMUNITY): Payer: Self-pay | Admitting: Emergency Medicine

## 2021-01-16 NOTE — Telephone Encounter (Signed)
Attempted to call patient regarding upcoming cardiac CT appointment. °Left message on voicemail with name and callback number °Soriya Worster RN Navigator Cardiac Imaging °[facility] °[phone] Office °[phone] Cell ° °

## 2021-01-17 ENCOUNTER — Other Ambulatory Visit: Payer: Self-pay

## 2021-01-17 ENCOUNTER — Other Ambulatory Visit: Payer: Self-pay | Admitting: Cardiology

## 2021-01-17 ENCOUNTER — Ambulatory Visit (HOSPITAL_COMMUNITY)
Admission: RE | Admit: 2021-01-17 | Discharge: 2021-01-17 | Disposition: A | Discharge status: Home / Self Care | Payer: 59 | Source: Ambulatory Visit | Attending: Cardiology | Admitting: Cardiology

## 2021-01-17 DIAGNOSIS — R9431 Abnormal electrocardiogram [ECG] [EKG]: Secondary | ICD-10-CM | POA: Diagnosis present

## 2021-01-17 DIAGNOSIS — R931 Abnormal findings on diagnostic imaging of heart and coronary circulation: Secondary | ICD-10-CM

## 2021-01-17 MED ORDER — NITROGLYCERIN 0.4 MG SL SUBL: 0.8000 mg | SUBLINGUAL_TABLET | Freq: Once | SUBLINGUAL | Status: AC

## 2021-01-17 MED ORDER — METOPROLOL TARTRATE 5 MG/5ML IV SOLN
5.0000 mg | INTRAVENOUS | Status: DC | PRN
  Administered 2021-01-17: 10 mg via INTRAVENOUS

## 2021-01-17 MED ORDER — IOHEXOL 350 MG/ML SOLN
100.0000 mL | Freq: Once | INTRAVENOUS | Status: AC | PRN
  Administered 2021-01-17: 95 mL via INTRAVENOUS

## 2021-01-17 MED ORDER — METOPROLOL TARTRATE 5 MG/5ML IV SOLN
INTRAVENOUS | Status: AC
  Filled 2021-01-17: qty 10

## 2021-01-17 MED ORDER — NITROGLYCERIN 0.4 MG SL SUBL
SUBLINGUAL_TABLET | SUBLINGUAL | Status: AC
  Administered 2021-01-17: 0.8 mg via SUBLINGUAL
  Filled 2021-01-17: qty 2

## 2021-02-19 NOTE — Progress Notes (Signed)
Date:  11/21/2020   ID:  [name], DOB [birth year], MRN [id number]  PCP:  Carol Ada, MD  Cardiologist:  Alethia Berthold, DO, [facility] (established care 11/21/20)  REASON FOR CONSULT: Family history of heart disease.  REQUESTING PHYSICIAN:  Self referral  Chief Complaint  Patient presents with   Abnormal findings diagnostic imaging of heart and coronary c   Follow-up   Results    HPI  Chelsea Ross is a 60 y.o. female who presents to the office with a chief complaint of " decreased physical endurance and family history of heart disease." Patient's past medical history and cardiovascular risk factors include: Diabetes mellitus type 2, hypertension, heartburn, former smoker, membranous glomerulonephritis, excessive alcohol use,  postmenopausal female.  Patient is self-referred to the practice for evaluation of heart disease given her symptoms of decreased physical endurance and family history.  Patient presents for 8-week follow-up.  Given elevated coronary calcium score and symptoms of reduced exercise tolerance recommended further ischemic evaluation with coronary CTA.  Also last office visit discussed initiation of statin therapy given diabetes and coronary calcification, however she was resistant to statin therapy therefore shared decision was to proceed with repeat lipid profile testing.  Upon recheck lipids are under excellent control.  CTA revealed CAD, however fractional flow analysis showed no significant stenosis.  CTA did reveal lung nodule for which patient has followed up with PCP/pulmonology for further recommendations.  Patient continues to complain of decreased exercise tolerance and fatigue. She is working on finding a new gym to start exercising regularly.   Patient consumes up to 4 alcoholic drinks per day.   FUNCTIONAL STATUS: No structured exercise program or daily routine.   ALLERGIES: No Known Allergies  MEDICATION LIST PRIOR TO VISIT: Current Meds   Medication Sig   aspirin EC 81 MG tablet Take 1 tablet (81 mg total) by mouth daily. Swallow whole.   glipiZIDE (GLUCOTROL) 5 MG tablet Take 5 mg by mouth daily.   linagliptin (TRADJENTA) 5 MG TABS tablet Take 1 tablet by mouth daily.   lipase/protease/amylase (CREON) 36000 UNITS CPEP capsule Take by mouth.   losartan (COZAAR) 25 MG tablet Take 25 mg by mouth every morning.   metFORMIN (GLUCOPHAGE-XR) 500 MG 24 hr tablet Take 500 mg by mouth 2 (two) times daily.     PAST MEDICAL HISTORY: Past Medical History:  Diagnosis Date   Diabetes mellitus without complication ([place])    GERD (gastroesophageal reflux disease)    Hypertension    pt states she does not have hypertension   Renal disorder     PAST SURGICAL HISTORY: Past Surgical History:  Procedure Laterality Date   MASS EXCISION Left 05/12/2017   Procedure: EXCISION MASS LEFT RING FINGER;  Surgeon: Leanora Cover, MD;  Location: [facility];  Service: Orthopedics;  Laterality: Left;    FAMILY HISTORY: The patient family history includes Breast cancer (age of onset: 35) in her mother; Cancer in her mother; Diabetes in her father; Hypertension in her father.  SOCIAL HISTORY:  The patient  reports that she has quit smoking. Her smoking use included cigarettes. She has a 60.00 pack-year smoking history. She has never used smokeless tobacco. She reports current alcohol use. She reports that she does not use drugs.  REVIEW OF SYSTEMS: Review of Systems  Constitutional: Positive for malaise/fatigue. Negative for chills and fever.  HENT:  Negative for hoarse voice and nosebleeds.   Eyes:  Negative for discharge, double vision and pain.  Cardiovascular:  Negative for chest pain, claudication, dyspnea on exertion, leg swelling, near-syncope, orthopnea, palpitations, paroxysmal nocturnal dyspnea and syncope.  Respiratory:  Negative for hemoptysis and shortness of breath.   Musculoskeletal:  Negative for muscle cramps and  myalgias.  Gastrointestinal:  Negative for abdominal pain, constipation, diarrhea, hematemesis, hematochezia, melena, nausea and vomiting.   PHYSICAL EXAM: Vitals with BMI 02/20/2021 01/17/2021 01/17/2021  Height 5' 5"  - -  Weight 112 lbs 10 oz - -  BMI 57.01 - -  Systolic 779 390 300  Diastolic 71 74 72  Pulse 83 68 63   CONSTITUTIONAL: Well-developed and well-nourished. No acute distress.  SKIN: Skin is warm and dry. No rash noted. No cyanosis. No pallor. No jaundice HEAD: Normocephalic and atraumatic.  EYES: No scleral icterus MOUTH/THROAT: Moist oral membranes.  NECK: No JVD present. No thyromegaly noted. No carotid bruits  LYMPHATIC: No visible cervical adenopathy.  CHEST Normal respiratory effort. No intercostal retractions  LUNGS: Clear to auscultation bilaterally. No stridor. No wheezes. No rales.  CARDIOVASCULAR: Regular rate and rhythm, positive S1-S2, no murmurs rubs or gallops appreciated.  ABDOMINAL: Nonobese, soft, nontender, nondistended, positive bowel sounds in all 4 quadrants, no apparent ascites.  EXTREMITIES: No peripheral edema, warm to touch bilaterally, 2+ DP pulses bilaterally. HEMATOLOGIC: No significant bruising NEUROLOGIC: Oriented to person, place, and time. Nonfocal. Normal muscle tone.  PSYCHIATRIC: Normal mood and affect. Normal behavior. Cooperative Physical exam unchanged compared to previous visit  CARDIAC DATABASE: EKG: 11/21/2020: Normal sinus rhythm, 79 bpm, normal axis, nonspecific ST-T changes, without underlying injury pattern.  Echocardiogram: 12/06/2020: Left ventricle cavity is normal in size and wall thickness. Normal global wall motion. Normal LV systolic function with EF 55%. Indeterminate diastolic filling pattern. Trileaflet aortic valve.  Mild (Grade I) aortic regurgitation. Normal right atrial pressure.   Stress Testing: No results found for this or any previous visit from the past 1095 days.  Coronary calcium score  12/11/2020: Left Main: 0 LAD: 97.4 LCx: 0 RCA: 154 Total Agatston Score: 251 MESA database percentile: 95  Multiple pulmonary nodules. Most severe: 6 mm right solid pulmonary nodule. Recommend a non-contrast Chest CT at 3-6 months. If patient is high risk for malignancy, recommend an additional non-contrast Chest CT at 18-24 months; if patient is low risk for malignancy a non-contrast Chest CT at 18-24 months is optional. Probable hepatic steatosis. Aortic Atherosclerosis (ICD10-I70.0).  Coronary CTA 01/17/2021: 1. Total coronary calcium score of 223. This was 94th percentile for age and sex matched control. 2. Normal coronary origin with right dominance. 3. CAD-RADS = 3. Left Main: Patent. LAD: mid LAD moderate stenosis (50-69%) due to calcified plaque. LCX: Patent. RCA: Proximal RCA moderate stenosis (50-69%) due to calcified plaque (closer to 50% stenosis). 4.  Aortic atherosclerosis (ascending and descending aorta).  1.  CT FFR analysis showed no significant stenosis.   RECOMMENDATIONS: Consider symptom-guided anti-ischemic pharmacotherapy as well as risk factor modification per guideline directed care.  Heart Catheterization: None  LABORATORY DATA: CBC Latest Ref Rng & Units 05/12/2017 01/03/2017 12/17/2016  WBC 4.0 - 10.5 K/uL - 6.6 9.0  Hemoglobin 12.0 - 15.0 g/dL 17.0(H) 15.7(H) 16.4(H)  Hematocrit 36.0 - 46.0 % 50.0(H) 46.4(H) 47.5(H)  Platelets 150 - 400 K/uL - 341 219    CMP Latest Ref Rng & Units 01/02/2021 05/12/2017 12/17/2016  Glucose 70 - 99 mg/dL 145(H) 142(H) 181(H)  BUN 8 - 27 mg/dL 8 12 8   Creatinine 0.57 - 1.00 mg/dL 0.88 0.50 0.62  Sodium 134 - 144  mmol/L 139 137 136  Potassium mmol/L CANCELED 4.3 4.4  Chloride 96 - 106 mmol/L 93(L) 101 99(L)  CO2 20 - 29 mmol/L 22 - 24  Calcium 8.7 - 10.3 mg/dL 8.9 - 9.0    Lipid Panel     Component Value Date/Time   CHOL 153 01/02/2021 0819   TRIG 98 01/02/2021 0819   HDL 74 01/02/2021 0819   LDLCALC 61  01/02/2021 0819   LABVLDL 18 01/02/2021 0819    No components found for: NTPROBNP No results for input(s): PROBNP in the last 8760 hours. No results for input(s): TSH in the last 8760 hours.  BMP Recent Labs    01/02/21 0819  NA 139  K CANCELED  CL 93*  CO2 22  GLUCOSE 145*  BUN 8  CREATININE 0.88  CALCIUM 8.9    05/25/2020: Glucose 209, BUN/Cr 9/0.64. EGFR 98. Na/K 139/4.3. Rest of the CMP normal HbA1C 8.6%  HEMOGLOBIN A1C No results found for: HGBA1C, MPG  IMPRESSION:    ICD-10-CM   1. Coronary artery disease involving native coronary artery of native heart without angina pectoris  I25.10     2. Benign hypertension  I10     3. Excessive drinking alcohol  F10.10        RECOMMENDATIONS: Chelsea Ross is a 60 y.o. female whose past medical history and cardiac risk factors include: Diabetes mellitus type 2, hypertension, heartburn, former smoker, membranous glomerulonephritis, excessive alcohol use,  postmenopausal female.  Reviewed and discussed at length with patient coronary CTA, her questions were addressed to her satisfaction.  Given CAD recommend aggressive risk factor modification and prevention.  Patient's lipids are well controlled, however given CAD would recommend statin therapy.  Discussed with patient benefits of statin therapy and cardiovascular risk reduction however she remains resistant to initiation of statin medication.  Also discussed starting low-dose beta-blocker therapy given underlying CAD, again patient acknowledged benefits of this but opts to hold off on initiating beta-blocker therapy at this time.  Patient will start aspirin 81 mg p.o. daily and continue losartan.  Blood pressure and lipids are well controlled.  Encourage patient to work on diet and lifestyle modifications including reducing alcohol consumption and increasing physical activity.  Patient is otherwise stable from a cardiovascular standpoint.  Advised follow-up in 6 months, however  patient prefers to follow-up in 1 year.  FINAL MEDICATION LIST END OF ENCOUNTER: Meds ordered this encounter  Medications   aspirin EC 81 MG tablet    Sig: Take 1 tablet (81 mg total) by mouth daily. Swallow whole.    Dispense:  90 tablet    Refill:  3     Medications Discontinued During This Encounter  Medication Reason   metoprolol tartrate (LOPRESSOR) 25 MG tablet Completed Course      Current Outpatient Medications:    aspirin EC 81 MG tablet, Take 1 tablet (81 mg total) by mouth daily. Swallow whole., Disp: 90 tablet, Rfl: 3   glipiZIDE (GLUCOTROL) 5 MG tablet, Take 5 mg by mouth daily., Disp: , Rfl:    linagliptin (TRADJENTA) 5 MG TABS tablet, Take 1 tablet by mouth daily., Disp: , Rfl:    lipase/protease/amylase (CREON) 36000 UNITS CPEP capsule, Take by mouth., Disp: , Rfl:    losartan (COZAAR) 25 MG tablet, Take 25 mg by mouth every morning., Disp: , Rfl:    metFORMIN (GLUCOPHAGE-XR) 500 MG 24 hr tablet, Take 500 mg by mouth 2 (two) times daily., Disp: , Rfl:    magnesium oxide (  MAG-OX) 400 MG tablet, Take 800-1,200 mg by mouth daily. (Patient not taking: Reported on 02/20/2021), Disp: , Rfl:    OneTouch Delica Lancets 45Y MISC, 1 kit by Misc.(Non-Drug; Combo Route) route daily., Disp: , Rfl:   No orders of the defined types were placed in this encounter.   There are no Patient Instructions on file for this visit.   --Continue cardiac medications as reconciled in final medication list. --Return in about 1 year (around 02/20/2022) for CAD, HTN, . Or sooner if needed. --Continue follow-up with your primary care physician regarding the management of your other chronic comorbid conditions.  Patient's questions and concerns were addressed to her satisfaction. She voices understanding of the instructions provided during this encounter.   This note was created using a voice recognition software as a result there may be grammatical errors inadvertently enclosed that do not  reflect the nature of this encounter. Every attempt is made to correct such errors.   Alethia Berthold, PA-C 02/20/2021, 11:36 AM Office: [phone]

## 2021-02-20 ENCOUNTER — Encounter: Payer: Self-pay | Admitting: Student

## 2021-02-20 ENCOUNTER — Ambulatory Visit: Payer: 59 | Admitting: Student

## 2021-02-20 ENCOUNTER — Other Ambulatory Visit: Payer: Self-pay

## 2021-02-20 VITALS — BP 125/71 | HR 83 | Temp 98.3°F | Ht 65.0 in | Wt 112.6 lb

## 2021-02-20 DIAGNOSIS — I10 Essential (primary) hypertension: Secondary | ICD-10-CM

## 2021-02-20 DIAGNOSIS — I2510 Atherosclerotic heart disease of native coronary artery without angina pectoris: Secondary | ICD-10-CM

## 2021-02-20 DIAGNOSIS — F1010 Alcohol abuse, uncomplicated: Secondary | ICD-10-CM

## 2021-02-20 MED ORDER — ASPIRIN EC 81 MG PO TBEC: 81.0000 mg | DELAYED_RELEASE_TABLET | Freq: Every day | ORAL | 3 refills | Status: DC

## 2021-06-25 ENCOUNTER — Telehealth: Payer: Self-pay | Admitting: Emergency Medicine

## 2021-06-26 NOTE — Telephone Encounter (Signed)
Chelsea Ross.  Will you please contact the patient to r/s this CT?  ?

## 2021-06-26 NOTE — Telephone Encounter (Signed)
Pt aware of new date and appt time ?

## 2021-07-03 ENCOUNTER — Other Ambulatory Visit: Payer: Self-pay | Admitting: Family Medicine

## 2021-07-03 DIAGNOSIS — Z1231 Encounter for screening mammogram for malignant neoplasm of breast: Secondary | ICD-10-CM

## 2021-07-10 ENCOUNTER — Other Ambulatory Visit (HOSPITAL_BASED_OUTPATIENT_CLINIC_OR_DEPARTMENT_OTHER): Payer: 59

## 2021-07-11 ENCOUNTER — Ambulatory Visit
Admission: RE | Admit: 2021-07-11 | Discharge: 2021-07-11 | Disposition: A | Discharge status: Home / Self Care | Payer: 59 | Source: Ambulatory Visit | Attending: Family Medicine | Admitting: Family Medicine

## 2021-07-11 DIAGNOSIS — Z1231 Encounter for screening mammogram for malignant neoplasm of breast: Secondary | ICD-10-CM

## 2021-07-13 ENCOUNTER — Ambulatory Visit (HOSPITAL_BASED_OUTPATIENT_CLINIC_OR_DEPARTMENT_OTHER): Payer: 59

## 2021-08-22 ENCOUNTER — Ambulatory Visit (HOSPITAL_BASED_OUTPATIENT_CLINIC_OR_DEPARTMENT_OTHER)
Admission: RE | Admit: 2021-08-22 | Discharge: 2021-08-22 | Disposition: A | Discharge status: Home / Self Care | Payer: 59 | Source: Ambulatory Visit | Attending: Emergency Medicine | Admitting: Emergency Medicine

## 2021-08-22 DIAGNOSIS — R918 Other nonspecific abnormal finding of lung field: Secondary | ICD-10-CM | POA: Diagnosis present

## 2021-09-28 ENCOUNTER — Other Ambulatory Visit: Payer: Self-pay

## 2021-09-28 DIAGNOSIS — R918 Other nonspecific abnormal finding of lung field: Secondary | ICD-10-CM

## 2021-12-24 ENCOUNTER — Other Ambulatory Visit (HOSPITAL_BASED_OUTPATIENT_CLINIC_OR_DEPARTMENT_OTHER): Payer: 59

## 2022-02-20 ENCOUNTER — Ambulatory Visit: Payer: 59 | Admitting: Student

## 2022-06-17 ENCOUNTER — Other Ambulatory Visit: Payer: Self-pay | Admitting: Family Medicine

## 2022-06-17 DIAGNOSIS — Z1231 Encounter for screening mammogram for malignant neoplasm of breast: Secondary | ICD-10-CM

## 2022-07-24 ENCOUNTER — Ambulatory Visit
Admission: RE | Admit: 2022-07-24 | Discharge: 2022-07-24 | Disposition: A | Discharge status: Home / Self Care | Payer: 59 | Source: Ambulatory Visit | Attending: Family Medicine | Admitting: Family Medicine

## 2022-07-24 DIAGNOSIS — Z1231 Encounter for screening mammogram for malignant neoplasm of breast: Secondary | ICD-10-CM

## 2022-11-21 ENCOUNTER — Telehealth: Payer: Self-pay | Admitting: Emergency Medicine

## 2022-11-21 DIAGNOSIS — R918 Other nonspecific abnormal finding of lung field: Secondary | ICD-10-CM

## 2022-11-22 NOTE — Telephone Encounter (Signed)
New order has been placed.

## 2022-12-23 ENCOUNTER — Ambulatory Visit (HOSPITAL_BASED_OUTPATIENT_CLINIC_OR_DEPARTMENT_OTHER): Payer: 59

## 2023-02-26 ENCOUNTER — Other Ambulatory Visit: Payer: Self-pay

## 2023-02-26 ENCOUNTER — Emergency Department (HOSPITAL_BASED_OUTPATIENT_CLINIC_OR_DEPARTMENT_OTHER)
Admission: EM | Admit: 2023-02-26 | Discharge: 2023-02-26 | Disposition: A | Discharge status: Home / Self Care | Payer: 59 | Attending: Emergency Medicine | Admitting: Emergency Medicine

## 2023-02-26 ENCOUNTER — Encounter (HOSPITAL_BASED_OUTPATIENT_CLINIC_OR_DEPARTMENT_OTHER): Payer: Self-pay | Admitting: Emergency Medicine

## 2023-02-26 DIAGNOSIS — E876 Hypokalemia: Secondary | ICD-10-CM | POA: Diagnosis present

## 2023-02-26 DIAGNOSIS — Z7982 Long term (current) use of aspirin: Secondary | ICD-10-CM | POA: Insufficient documentation

## 2023-02-26 DIAGNOSIS — Z7984 Long term (current) use of oral hypoglycemic drugs: Secondary | ICD-10-CM | POA: Diagnosis not present

## 2023-02-26 DIAGNOSIS — E119 Type 2 diabetes mellitus without complications: Secondary | ICD-10-CM | POA: Diagnosis not present

## 2023-02-26 DIAGNOSIS — Z87891 Personal history of nicotine dependence: Secondary | ICD-10-CM | POA: Diagnosis not present

## 2023-02-26 LAB — CBC WITH DIFFERENTIAL/PLATELET
Abs Immature Granulocytes: 0.02 10*3/uL (ref 0.00–0.07)
Basophils Absolute: 0.1 10*3/uL (ref 0.0–0.1)
Basophils Relative: 1 %
Eosinophils Absolute: 0.1 10*3/uL (ref 0.0–0.5)
Eosinophils Relative: 1 %
HCT: 30.5 % — ABNORMAL LOW (ref 36.0–46.0)
Hemoglobin: 10.6 g/dL — ABNORMAL LOW (ref 12.0–15.0)
Immature Granulocytes: 0 %
Lymphocytes Relative: 20 %
Lymphs Abs: 1.4 10*3/uL (ref 0.7–4.0)
MCH: 39 pg — ABNORMAL HIGH (ref 26.0–34.0)
MCHC: 34.8 g/dL (ref 30.0–36.0)
MCV: 112.1 fL — ABNORMAL HIGH (ref 80.0–100.0)
Monocytes Absolute: 0.8 10*3/uL (ref 0.1–1.0)
Monocytes Relative: 11 %
Neutro Abs: 4.6 10*3/uL (ref 1.7–7.7)
Neutrophils Relative %: 67 %
Platelets: 122 10*3/uL — ABNORMAL LOW (ref 150–400)
RBC: 2.72 MIL/uL — ABNORMAL LOW (ref 3.87–5.11)
RDW: 14.4 % (ref 11.5–15.5)
Smear Review: NORMAL
WBC: 6.9 10*3/uL (ref 4.0–10.5)
nRBC: 0 % (ref 0.0–0.2)

## 2023-02-26 LAB — URINALYSIS, ROUTINE W REFLEX MICROSCOPIC
Bilirubin Urine: NEGATIVE
Glucose, UA: NEGATIVE mg/dL
Hgb urine dipstick: NEGATIVE
Ketones, ur: NEGATIVE mg/dL
Nitrite: NEGATIVE
Protein, ur: NEGATIVE mg/dL
Specific Gravity, Urine: 1.015 (ref 1.005–1.030)
pH: 5.5 (ref 5.0–8.0)

## 2023-02-26 LAB — COMPREHENSIVE METABOLIC PANEL
ALT: 25 U/L (ref 0–44)
AST: 80 U/L — ABNORMAL HIGH (ref 15–41)
Albumin: 2.7 g/dL — ABNORMAL LOW (ref 3.5–5.0)
Alkaline Phosphatase: 110 U/L (ref 38–126)
Anion gap: 13 (ref 5–15)
BUN: 5 mg/dL — ABNORMAL LOW (ref 8–23)
CO2: 21 mmol/L — ABNORMAL LOW (ref 22–32)
Calcium: 8.3 mg/dL — ABNORMAL LOW (ref 8.9–10.3)
Chloride: 100 mmol/L (ref 98–111)
Creatinine, Ser: 0.78 mg/dL (ref 0.44–1.00)
GFR, Estimated: >60 mL/min (ref >60)
Glucose, Bld: 250 mg/dL — ABNORMAL HIGH (ref 70–99)
Potassium: 3.3 mmol/L — ABNORMAL LOW (ref 3.5–5.1)
Sodium: 134 mmol/L — ABNORMAL LOW (ref 135–145)
Total Bilirubin: 1.1 mg/dL (ref <1.2)
Total Protein: 6 g/dL — ABNORMAL LOW (ref 6.5–8.1)

## 2023-02-26 LAB — MAGNESIUM: Magnesium: 1.1 mg/dL — ABNORMAL LOW (ref 1.7–2.4)

## 2023-02-26 LAB — URINALYSIS, MICROSCOPIC (REFLEX)

## 2023-02-26 MED ORDER — POTASSIUM CHLORIDE CRYS ER 20 MEQ PO TBCR
40.0000 meq | EXTENDED_RELEASE_TABLET | Freq: Once | ORAL | Status: AC
Start: 2023-02-26 — End: 2023-02-26
  Administered 2023-02-26: 40 meq via ORAL
  Filled 2023-02-26: qty 2

## 2023-02-26 MED ORDER — MAGNESIUM OXIDE -MG SUPPLEMENT 400 (240 MG) MG PO TABS
800.0000 mg | ORAL_TABLET | Freq: Once | ORAL | Status: AC
  Administered 2023-02-26: 800 mg via ORAL
  Filled 2023-02-26: qty 2

## 2023-02-26 NOTE — ED Provider Notes (Signed)
[place] EMERGENCY DEPARTMENT AT [facility] [place] Provider Note  CSN: [id number] Arrival date & time: 02/26/23 4098  Chief Complaint(s) No chief complaint on file.  HPI Chelsea Ross is a 62 y.o. female with past medical history as below, significant for GERD, DM, who presents to the ED with complaint of low potassium  She had outpatient labs drawn, potassium show to be 2.8.  She had recent diarrheal illness.  She is followed by gastroenterology for her digestive issues.  She also reports blood in her urine a week ago, she had urinalysis performed in GI office with gross contamination  All of the symptoms have resolved, she reports she is feeling back to normal, she has been eating and drinking normally, using the bathroom normally.  No vomiting.  No ongoing diarrhea.  Reports she does have intermittent diarrhea in the past and occurs intermittently without warning and resolved spontaneously.  She is following closely with GI in regards to this.  Past Medical History Past Medical History:  Diagnosis Date   Diabetes mellitus without complication (H[facility])    GERD (gastroesophageal reflux disease)    Renal disorder    Patient Active Problem List   Diagnosis Date Noted   Elevated coronary artery calcium score    Pulmonary nodules/lesions, multiple 01/10/2021   Dyspnea on exertion 01/10/2021   Home Medication(s) Prior to Admission medications   Medication Sig Start Date End Date Taking? Authorizing Provider  aspirin EC 81 MG tablet Take 1 tablet (81 mg total) by mouth daily. Swallow whole. 02/20/21   Cantwell, Celeste C, PA-C  glipiZIDE (GLUCOTROL) 5 MG tablet Take 5 mg by mouth daily. 09/07/20   [provider]  linagliptin (TRADJENTA) 5 MG TABS tablet Take 1 tablet by mouth daily. 07/04/20   [provider]  lipase/protease/amylase (CREON) 36000 UNITS CPEP capsule Take by mouth.    [provider]  losartan (COZAAR) 25 MG tablet Take 25 mg by mouth every  morning.    [provider]  magnesium oxide (MAG-OX) 400 MG tablet Take 800-1,200 mg by mouth daily. Patient not taking: Reported on 02/20/2021    [provider]  OneTouch Delica Lancets 30G MISC 1 kit by Misc.(Non-Drug; Combo Route) route daily. 07/30/19   [provider]                                                                                                                                    Past Surgical History Past Surgical History:  Procedure Laterality Date   MASS EXCISION Left 05/12/2017   Procedure: EXCISION MASS LEFT RING FINGER;  Surgeon: Betha Loa, MD;  Location: [facility];  Service: Orthopedics;  Laterality: Left;   Family History Family History  Problem Relation Age of Onset   Cancer Mother    Breast cancer Mother 79       mother had breast cancer 3 times   Hypertension Father  Diabetes Father     Social History Social History   Tobacco Use   Smoking status: Former    Current packs/day: 2.00    Average packs/day: 2.0 packs/day for 30.0 years (60.0 ttl pk-yrs)    Types: Cigarettes   Smokeless tobacco: Never  Vaping Use   Vaping status: Never Used  Substance Use Topics   Alcohol use: Yes    Comment: daily 4 drinks   Drug use: No   Allergies Patient has no known allergies.  Review of Systems Review of Systems  Constitutional:  Negative for chills and fever.  HENT:  Negative for congestion.   Respiratory:  Negative for shortness of breath.   Cardiovascular:  Negative for chest pain.  Gastrointestinal:  Negative for abdominal pain and vomiting.  Genitourinary:  Negative for dysuria and vaginal bleeding.  Neurological:  Negative for light-headedness.  All other systems reviewed and are negative.   Physical Exam Vital Signs  I have reviewed the triage vital signs BP 102/65 (BP Location: Right Arm)   Pulse 95   Temp 97.8 F (36.6 C) (Oral)   Resp 20   Ht 5\' 5"  (1.651 m)   Wt 47 kg   SpO2 100%    BMI 17.24 kg/m  Physical Exam Vitals and nursing note reviewed.  Constitutional:      General: She is not in acute distress.    Appearance: Normal appearance.  HENT:     Head: Normocephalic and atraumatic.     Right Ear: External ear normal.     Left Ear: External ear normal.     Nose: Nose normal.     Mouth/Throat:     Mouth: Mucous membranes are moist.  Eyes:     General: No scleral icterus.       Right eye: No discharge.        Left eye: No discharge.  Cardiovascular:     Rate and Rhythm: Normal rate and regular rhythm.     Pulses: Normal pulses.     Heart sounds: Normal heart sounds.  Pulmonary:     Effort: Pulmonary effort is normal. No respiratory distress.     Breath sounds: Normal breath sounds. No stridor.  Abdominal:     General: Abdomen is flat. There is no distension.     Palpations: Abdomen is soft.     Tenderness: There is no abdominal tenderness. There is no guarding.  Musculoskeletal:     Cervical back: No rigidity.     Right lower leg: No edema.     Left lower leg: No edema.  Skin:    General: Skin is warm and dry.     Capillary Refill: Capillary refill takes less than 2 seconds.  Neurological:     Mental Status: She is alert.  Psychiatric:        Mood and Affect: Mood normal.        Behavior: Behavior normal. Behavior is cooperative.     ED Results and Treatments Labs (all labs ordered are listed, but only abnormal results are displayed) Labs Reviewed  CBC WITH DIFFERENTIAL/PLATELET - Abnormal; Notable for the following components:      Result Value   RBC 2.72 (*)    Hemoglobin 10.6 (*)    HCT 30.5 (*)    MCV 112.1 (*)    MCH 39.0 (*)    Platelets 122 (*)    All other components within normal limits  COMPREHENSIVE METABOLIC PANEL - Abnormal; Notable for the following components:  Sodium 134 (*)    Potassium 3.3 (*)    CO2 21 (*)    Glucose, Bld 250 (*)    BUN 5 (*)    Calcium 8.3 (*)    Total Protein 6.0 (*)    Albumin 2.7 (*)     AST 80 (*)    All other components within normal limits  URINALYSIS, ROUTINE W REFLEX MICROSCOPIC - Abnormal; Notable for the following components:   Leukocytes,Ua TRACE (*)    All other components within normal limits  MAGNESIUM - Abnormal; Notable for the following components:   Magnesium 1.1 (*)    All other components within normal limits  URINALYSIS, MICROSCOPIC (REFLEX) - Abnormal; Notable for the following components:   Bacteria, UA RARE (*)    All other components within normal limits                                                                                                                          Radiology No results found.  Pertinent labs & imaging results that were available during my care of the patient were reviewed by me and considered in my medical decision making (see MDM for details).  Medications Ordered in ED Medications  magnesium oxide (MAG-OX) tablet 800 mg (800 mg Oral Given 02/26/23 0942)  potassium chloride SA (KLOR-CON M) CR tablet 40 mEq (40 mEq Oral Given 02/26/23 9629)                                                                                                                                     Procedures Procedures  (including critical care time)  Medical Decision Making / ED Course    Medical Decision Making:    REKA SNIDOW is a 62 y.o. female with past medical history as below, significant for GERD, DM, who presents to the ED with complaint of low potassium. The complaint involves an extensive differential diagnosis and also carries with it a high risk of complications and morbidity.  Serious etiology was considered. Ddx includes but is not limited to: Metabolic derangement, hypomag, hypokalemia, UTI, etc.  Complete initial physical exam performed, notably the patient was in no acute distress, reports feeling "great"    Reviewed and confirmed nursing documentation for past medical history, family history, social history.  Vital signs  reviewed.      Clinical Course as of 02/26/23 1107  Wed  Feb 26, 2023  0454 K was 2.8 two days ago [SG]  0917 Hemoglobin(!): 10.6 Hemoglobin 02/24/2023 was 10.7 on outside labs  [SG]  1104 AST(!): 80 Similar to prior  [SG]  1106 Magnesium(!): 1.1 Chronically low per pt, intermittent mgox tablet usage  [SG]    Clinical Course User Index [SG] Sloan Leiter, DO    Brief summary: 62 year old female here with low potassium. Will recheck labs and urine today.  Is tolerating p.o. without difficulty.  Reasonable that her recent hypokalemia secondary to recent bout of diarrhea which is since resolved.  She also reports that her urine has normalized.  Labs reviewed, potassium is mildly depleted, magnesium is low.  She takes potassium magnesium supplements at home, takes her magnesium intermittently because it is difficult for her to swallow the pills.  She is tolerant p.o. tablet difficulty.  Given potassium and magnesium replacement in the ER.  Reports that she feels normal.  Tolerant p.o. difficulty.  Requesting discharge.  Advised she follow-up with urology for transient episode of hematuria.  Follow-up PCP for recheck of her labs in the next week or so.  The patient improved significantly and was discharged in stable condition. Detailed discussions were had with the patient regarding current findings, and need for close f/u with PCP or on call doctor. The patient has been instructed to return immediately if the symptoms worsen in any way for re-evaluation. Patient verbalized understanding and is in agreement with current care plan. All questions answered prior to discharge.                Additional history obtained: -Additional history obtained from na -External records from outside source obtained and reviewed including: Chart review including previous notes, labs, imaging, consultation notes including  Primary care documentation, home medications, prior labs   Lab  Tests: -I ordered, reviewed, and interpreted labs.   The pertinent results include:   Labs Reviewed  CBC WITH DIFFERENTIAL/PLATELET - Abnormal; Notable for the following components:      Result Value   RBC 2.72 (*)    Hemoglobin 10.6 (*)    HCT 30.5 (*)    MCV 112.1 (*)    MCH 39.0 (*)    Platelets 122 (*)    All other components within normal limits  COMPREHENSIVE METABOLIC PANEL - Abnormal; Notable for the following components:   Sodium 134 (*)    Potassium 3.3 (*)    CO2 21 (*)    Glucose, Bld 250 (*)    BUN 5 (*)    Calcium 8.3 (*)    Total Protein 6.0 (*)    Albumin 2.7 (*)    AST 80 (*)    All other components within normal limits  URINALYSIS, ROUTINE W REFLEX MICROSCOPIC - Abnormal; Notable for the following components:   Leukocytes,Ua TRACE (*)    All other components within normal limits  MAGNESIUM - Abnormal; Notable for the following components:   Magnesium 1.1 (*)    All other components within normal limits  URINALYSIS, MICROSCOPIC (REFLEX) - Abnormal; Notable for the following components:   Bacteria, UA RARE (*)    All other components within normal limits    Notable for as above  EKG   EKG Interpretation Date/Time:    Ventricular Rate:    PR Interval:    QRS Duration:    QT Interval:    QTC Calculation:   R Axis:      Text Interpretation:  Imaging Studies ordered: na   Medicines ordered and prescription drug management: Meds ordered this encounter  Medications   magnesium oxide (MAG-OX) tablet 800 mg   potassium chloride SA (KLOR-CON M) CR tablet 40 mEq    -I have reviewed the patients home medicines and have made adjustments as needed   Consultations Obtained: na   Cardiac Monitoring: Continuous pulse oximetry interpreted by myself, 100% on RA.    Social Determinants of Health:  Diagnosis or treatment significantly limited by social determinants of health: former smoker   Reevaluation: After the interventions  noted above, I reevaluated the patient and found that they have resolved  Co morbidities that complicate the patient evaluation  Past Medical History:  Diagnosis Date   Diabetes mellitus without complication ([facility])    GERD (gastroesophageal reflux disease)    Renal disorder       Dispostion: Disposition decision including need for hospitalization was considered, and patient discharged from emergency department.    Final Clinical Impression(s) / ED Diagnoses Final diagnoses:  Hypomagnesemia  Hypokalemia        Sloan Leiter, DO 02/26/23 1107

## 2023-02-26 NOTE — Discharge Instructions (Addendum)
It was a pleasure caring for you today in the emergency department.  Please take your home magnesium tablets daily, continue taking potassium. Follow up with your kidney doctor in regards to transient blood in your urine.  Follow-up with your PCP for recheck of blood work in the next week.  Please return to the emergency department for any worsening or worrisome symptoms.

## 2023-02-26 NOTE — ED Triage Notes (Addendum)
Had labs drawn on Monday called  on Monday and to tell her her potassium was lo0w , states had a stomach bug last weekend also has  br blood  in her urine

## 2023-03-24 ENCOUNTER — Encounter (HOSPITAL_BASED_OUTPATIENT_CLINIC_OR_DEPARTMENT_OTHER): Payer: Self-pay

## 2023-03-24 ENCOUNTER — Emergency Department (HOSPITAL_BASED_OUTPATIENT_CLINIC_OR_DEPARTMENT_OTHER): Payer: 59

## 2023-03-24 ENCOUNTER — Other Ambulatory Visit: Payer: Self-pay

## 2023-03-24 ENCOUNTER — Emergency Department (HOSPITAL_BASED_OUTPATIENT_CLINIC_OR_DEPARTMENT_OTHER)
Admission: EM | Admit: 2023-03-24 | Discharge: 2023-03-24 | Disposition: A | Discharge status: Home / Self Care | Payer: 59 | Source: Home / Self Care | Attending: Emergency Medicine | Admitting: Emergency Medicine

## 2023-03-24 DIAGNOSIS — R600 Localized edema: Secondary | ICD-10-CM | POA: Insufficient documentation

## 2023-03-24 DIAGNOSIS — K7030 Alcoholic cirrhosis of liver without ascites: Secondary | ICD-10-CM | POA: Diagnosis not present

## 2023-03-24 DIAGNOSIS — K7031 Alcoholic cirrhosis of liver with ascites: Secondary | ICD-10-CM | POA: Insufficient documentation

## 2023-03-24 DIAGNOSIS — Z7982 Long term (current) use of aspirin: Secondary | ICD-10-CM | POA: Insufficient documentation

## 2023-03-24 DIAGNOSIS — I81 Portal vein thrombosis: Secondary | ICD-10-CM | POA: Insufficient documentation

## 2023-03-24 LAB — I-STAT VENOUS BLOOD GAS, ED
Acid-base deficit: 8 mmol/L — ABNORMAL HIGH (ref 0.0–2.0)
Bicarbonate: 14.3 mmol/L — ABNORMAL LOW (ref 20.0–28.0)
Calcium, Ion: 0.89 mmol/L — CL (ref 1.15–1.40)
HCT: 31 % — ABNORMAL LOW (ref 36.0–46.0)
Hemoglobin: 10.5 g/dL — ABNORMAL LOW (ref 12.0–15.0)
O2 Saturation: 75 %
Patient temperature: 97.8
Potassium: 3.4 mmol/L — ABNORMAL LOW (ref 3.5–5.1)
Sodium: 130 mmol/L — ABNORMAL LOW (ref 135–145)
TCO2: 15 mmol/L — ABNORMAL LOW (ref 22–32)
pCO2, Ven: 20.4 mm[Hg] — ABNORMAL LOW (ref 44–60)
pH, Ven: 7.451 — ABNORMAL HIGH (ref 7.25–7.43)
pO2, Ven: 36 mm[Hg] (ref 32–45)

## 2023-03-24 LAB — PROTIME-INR
INR: 1.6 — ABNORMAL HIGH (ref 0.8–1.2)
Prothrombin Time: 19 s — ABNORMAL HIGH (ref 11.4–15.2)

## 2023-03-24 LAB — ACETAMINOPHEN LEVEL: Acetaminophen (Tylenol), Serum: <10 ug/mL — ABNORMAL LOW (ref 10–30)

## 2023-03-24 LAB — COMPREHENSIVE METABOLIC PANEL
ALT: 32 U/L (ref 0–44)
AST: 102 U/L — ABNORMAL HIGH (ref 15–41)
Albumin: 2.1 g/dL — ABNORMAL LOW (ref 3.5–5.0)
Alkaline Phosphatase: 205 U/L — ABNORMAL HIGH (ref 38–126)
Anion gap: 16 — ABNORMAL HIGH (ref 5–15)
BUN: 15 mg/dL (ref 8–23)
CO2: 14 mmol/L — ABNORMAL LOW (ref 22–32)
Calcium: 7.4 mg/dL — ABNORMAL LOW (ref 8.9–10.3)
Chloride: 96 mmol/L — ABNORMAL LOW (ref 98–111)
Creatinine, Ser: 1.88 mg/dL — ABNORMAL HIGH (ref 0.44–1.00)
GFR, Estimated: 30 mL/min — ABNORMAL LOW (ref >60)
Glucose, Bld: 372 mg/dL — ABNORMAL HIGH (ref 70–99)
Potassium: 3.4 mmol/L — ABNORMAL LOW (ref 3.5–5.1)
Sodium: 126 mmol/L — ABNORMAL LOW (ref 135–145)
Total Bilirubin: 3 mg/dL — ABNORMAL HIGH (ref 0.0–1.2)
Total Protein: 5.7 g/dL — ABNORMAL LOW (ref 6.5–8.1)

## 2023-03-24 LAB — CBC WITH DIFFERENTIAL/PLATELET
Abs Immature Granulocytes: 0.07 10*3/uL (ref 0.00–0.07)
Basophils Absolute: 0.1 10*3/uL (ref 0.0–0.1)
Basophils Relative: 1 %
Eosinophils Absolute: 0 10*3/uL (ref 0.0–0.5)
Eosinophils Relative: 0 %
HCT: 26.1 % — ABNORMAL LOW (ref 36.0–46.0)
Hemoglobin: 9.2 g/dL — ABNORMAL LOW (ref 12.0–15.0)
Immature Granulocytes: 1 %
Lymphocytes Relative: 7 %
Lymphs Abs: 0.8 10*3/uL (ref 0.7–4.0)
MCH: 36.4 pg — ABNORMAL HIGH (ref 26.0–34.0)
MCHC: 35.2 g/dL (ref 30.0–36.0)
MCV: 103.2 fL — ABNORMAL HIGH (ref 80.0–100.0)
Monocytes Absolute: 1.2 10*3/uL — ABNORMAL HIGH (ref 0.1–1.0)
Monocytes Relative: 11 %
Neutro Abs: 9.4 10*3/uL — ABNORMAL HIGH (ref 1.7–7.7)
Neutrophils Relative %: 80 %
Platelets: 269 10*3/uL (ref 150–400)
RBC: 2.53 MIL/uL — ABNORMAL LOW (ref 3.87–5.11)
RDW: 15.8 % — ABNORMAL HIGH (ref 11.5–15.5)
WBC: 11.6 10*3/uL — ABNORMAL HIGH (ref 4.0–10.5)
nRBC: 0 % (ref 0.0–0.2)

## 2023-03-24 LAB — TECHNOLOGIST SMEAR REVIEW: RBC MORPHOLOGY: NONE SEEN

## 2023-03-24 LAB — TROPONIN I (HIGH SENSITIVITY): Troponin I (High Sensitivity): 12 ng/L (ref <18)

## 2023-03-24 LAB — BRAIN NATRIURETIC PEPTIDE: B Natriuretic Peptide: 86.2 pg/mL (ref 0.0–100.0)

## 2023-03-24 LAB — LACTIC ACID, PLASMA: Lactic Acid, Venous: 6.2 mmol/L (ref 0.5–1.9)

## 2023-03-24 LAB — LIPASE, BLOOD: Lipase: 18 U/L (ref 11–51)

## 2023-03-24 NOTE — ED Provider Notes (Addendum)
  EMERGENCY DEPARTMENT AT [facility] Provider Note   CSN: [id number] Arrival date & time: 03/24/23  0755     History  Chief Complaint  Patient presents with   Leg Swelling   Ascites    Chelsea Ross is a 63 y.o. female.  Is here with swelling distention and leg swelling.  Noticed over the last couple weeks leg swelling the last couple days.  Daily alcohol use.  Denies any recent nausea vomiting diarrhea illness.  No chest pain shortness of breath.  No history of heart failure or kidney disease.  He is unaware of any liver issues.  Denies any weakness numbness tingling.  She is mostly concerned that the abdominal wall is more swollen legs are swollen now.  Not having any difficulty breathing.  Not having any abdominal pain.  Denies any fevers or chills.  The history is provided by the patient.       Home Medications Prior to Admission medications   Medication Sig Start Date End Date Taking? Authorizing Provider  aspirin  EC 81 MG tablet Take 1 tablet (81 mg total) by mouth daily. Swallow whole. 02/20/21   Cantwell, Celeste C, PA-C  glipiZIDE (GLUCOTROL) 5 MG tablet Take 5 mg by mouth daily. 09/07/20   [provider]  linagliptin (TRADJENTA) 5 MG TABS tablet Take 1 tablet by mouth daily. 07/04/20   [provider]  lipase/protease/amylase (CREON ) 36000 UNITS CPEP capsule Take by mouth.    [provider]  losartan (COZAAR) 25 MG tablet Take 25 mg by mouth every morning.    [provider]  magnesium  oxide (MAG-OX) 400 MG tablet Take 800-1,200 mg by mouth daily. Patient not taking: Reported on 02/20/2021    [provider]  OneTouch Delica Lancets 30G MISC 1 kit by Misc.(Non-Drug; Combo Route) route daily. 07/30/19   [provider]      Allergies    Patient has no known allergies.    Review of Systems   Review of Systems  Physical Exam Updated Vital Signs BP 105/66   Pulse (!) 103   Temp 97.8 F (36.6  C) (Oral)   Resp 17   Wt 55.8 kg   SpO2 98%   BMI 20.47 kg/m  Physical Exam Vitals and nursing note reviewed.  Constitutional:      General: She is not in acute distress.    Appearance: She is well-developed. She is not ill-appearing.  HENT:     Head: Normocephalic and atraumatic.  Eyes:     Conjunctiva/sclera: Conjunctivae normal.     Pupils: Pupils are equal, round, and reactive to light.  Cardiovascular:     Rate and Rhythm: Normal rate and regular rhythm.     Pulses: Normal pulses.     Heart sounds: No murmur heard. Pulmonary:     Effort: Pulmonary effort is normal. No respiratory distress.     Breath sounds: Normal breath sounds.  Abdominal:     General: There is distension.     Palpations: Abdomen is soft.     Tenderness: There is no abdominal tenderness.  Musculoskeletal:        General: No swelling.     Cervical back: Neck supple.     Right lower leg: Edema present.     Left lower leg: Edema present.  Skin:    General: Skin is warm and dry.     Capillary Refill: Capillary refill takes less than 2 seconds.  Neurological:     Mental  Status: She is alert.  Psychiatric:        Mood and Affect: Mood normal.     ED Results / Procedures / Treatments   Labs (all labs ordered are listed, but only abnormal results are displayed) Labs Reviewed  CBC WITH DIFFERENTIAL/PLATELET - Abnormal; Notable for the following components:      Result Value   WBC 11.6 (*)    RBC 2.53 (*)    Hemoglobin 9.2 (*)    HCT 26.1 (*)    MCV 103.2 (*)    MCH 36.4 (*)    RDW 15.8 (*)    Neutro Abs 9.4 (*)    Monocytes Absolute 1.2 (*)    All other components within normal limits  COMPREHENSIVE METABOLIC PANEL - Abnormal; Notable for the following components:   Sodium 126 (*)    Potassium 3.4 (*)    Chloride 96 (*)    CO2 14 (*)    Glucose, Bld 372 (*)    Creatinine, Ser 1.88 (*)    Calcium 7.4 (*)    Total Protein 5.7 (*)    Albumin  2.1 (*)    AST 102 (*)    Alkaline  Phosphatase 205 (*)    Total Bilirubin 3.0 (*)    GFR, Estimated 30 (*)    Anion gap 16 (*)    All other components within normal limits  PROTIME-INR - Abnormal; Notable for the following components:   Prothrombin Time 19.0 (*)    INR 1.6 (*)    All other components within normal limits  LACTIC ACID, PLASMA - Abnormal; Notable for the following components:   Lactic Acid, Venous 6.2 (*)    All other components within normal limits  ACETAMINOPHEN  LEVEL - Abnormal; Notable for the following components:   Acetaminophen  (Tylenol ), Serum <10 (*)    All other components within normal limits  I-STAT VENOUS BLOOD GAS, ED - Abnormal; Notable for the following components:   pH, Ven 7.451 (*)    pCO2, Ven 20.4 (*)    Bicarbonate 14.3 (*)    TCO2 15 (*)    Acid-base deficit 8.0 (*)    Sodium 130 (*)    Potassium 3.4 (*)    Calcium, Ion 0.89 (*)    HCT 31.0 (*)    Hemoglobin 10.5 (*)    All other components within normal limits  LIPASE, BLOOD  BRAIN NATRIURETIC PEPTIDE  TECHNOLOGIST SMEAR REVIEW  LACTIC ACID, PLASMA  BLOOD GAS, VENOUS  TROPONIN I (HIGH SENSITIVITY)    EKG EKG Interpretation Date/Time:  Monday March 24 2023 08:15:18 EST Ventricular Rate:  91 PR Interval:  129 QRS Duration:  72 QT Interval:  461 QTC Calculation: 568 R Axis:   41  Text Interpretation: Sinus rhythm Low voltage, precordial leads Nonspecific T abnormalities, lateral leads Prolonged QT interval Confirmed by Ruthe Cornet [phone]) on 03/24/2023 8:19:36 AM  Radiology US  Abdomen Limited RUQ (LIVER/GB) Addendum Date: 03/24/2023 ADDENDUM REPORT: 03/24/2023 09:25 ADDENDUM: Study discussed by telephone with Dr. CORNET RUTHE on 03/24/2023 at 0920 hours. Electronically Signed   By: VEAR Hurst M.D.   On: 03/24/2023 09:25   Result Date: 03/24/2023 CLINICAL DATA:  63 year old female with abdominal swelling. EXAM: ULTRASOUND ABDOMEN LIMITED RIGHT UPPER QUADRANT COMPARISON:  Abdomen MRI 07/08/2016.  Chest CT  08/22/2021. FINDINGS: Gallbladder: Gallbladder wall thickening in the presence of ascites (image 7). No sludge or stones identified within the lumen. No sonographic Murphy sign elicited. Common bile duct: Diameter: 3 mm, normal. Liver: Chronic hepatic  steatosis. Echogenic liver and coarse hepatic echotexture. Nodular liver contour (image 43). No discrete liver lesion by ultrasound. No main portal vein flow is detected with color Doppler (image 51). Other: Right upper quadrant ascites, fluid volume appears at least moderate (image 40). This is new since 2023. Nonobstructed visible right kidney. IMPRESSION: Chronic Hepatic steatosis with evidence now of Cirrhosis, PORTAL VEIN THROMBOSIS, and right upper quadrant ascites which is new since 2023. Electronically Signed: By: VEAR Hurst M.D. On: 03/24/2023 09:11   DG Chest Portable 1 View Result Date: 03/24/2023 CLINICAL DATA:  Leg swelling. EXAM: PORTABLE CHEST 1 VIEW COMPARISON:  None Available. FINDINGS: Bilateral lung fields are clear. No pulmonary embolism. Bilateral costophrenic angles are clear. Normal cardio-mediastinal silhouette. No acute osseous abnormalities. The soft tissues are within normal limits. IMPRESSION: *No acute cardiopulmonary abnormality. Electronically Signed   By: Ree Molt M.D.   On: 03/24/2023 08:39    Procedures Procedures    Medications Ordered in ED Medications - No data to display  ED Course/ Medical Decision Making/ A&P                                 Medical Decision Making Amount and/or Complexity of Data Reviewed Labs: ordered. Radiology: ordered.   Chelsea Ross is here with leg swelling and abdominal swelling.  Has noticed abdominal swelling in the last couple weeks now with some lower leg swelling the last few days.  Has had decreased appetite.  Drinks alcohol daily.  Denies any abdominal pain nausea vomiting diarrhea.  History of diabetes and reflux otherwise.  No recent surgery or travel.  Denies any chest  pain shortness of breath or problems urinating.  She got abdominal distention on exam with leg swelling bilaterally.  Does not appear jaundiced but differential diagnosis likely cirrhosis/liver dysfunction/less likely heart failure CKD.  Per my review and interpretation of labs patient does have continued elevation of liver enzymes with AST of 102, alk phos of 205, T. bili of 3.  Anion gap is 16 bicarb is 14 blood sugar is 372 however blood gas shows a pH of 7.4.  Lactic acid is 6. I think labs are secondary to liver dysfunction.  She got portal vein thrombosis and now cirrhosis changes and ascites on ultrasound per radiology over the phone.  I talked with Dr. Burnette with gastroenterology.  He recommends admission to medicine for further optimization and care.  I do not think she has SBP.  She has no fever, no major white count, no abdominal pain.  He recommends holding off on anticoagulation until they can evaluate her.  Ultimately I discussed this with the patient and that she is now signs of liver failure with portal vein clot and needs admission for GI workup and further optimization of her care and meds.  Talked her about paracentesis which she is hesitant about as well.  Overall she does not want to stay for further workup and prefers to take care of things at home think come back to [facility] later.  Told her that this is life-threatening and something that could kill her but patient wanted to leave AGAINST MEDICAL ADVICE that she did not want to go to [facility] and ambulance.  She need to take care of things at home first.  She states that she will go back to the hospital and she is able to.  Overall patient understands the risks and benefits  of leaving versus staying.  She has capacity make this decision.  She understands that this is life or death.  Patient left AMA.  This chart was dictated using voice recognition software.  Despite best efforts to proofread,  errors can occur which can  change the documentation meaning.     Final Clinical Impression(s) / ED Diagnoses Final diagnoses:  Alcoholic cirrhosis of liver with ascites (HCC)  Portal vein thrombosis    Rx / DC Orders ED Discharge Orders     None         Ruthe Cornet, DO 03/24/23 1220    Ruthe Cornet, DO 03/24/23 1228    Ruthe Cornet, DO 03/24/23 1233

## 2023-03-24 NOTE — ED Notes (Signed)
 Fall risk armband Fall risk socks Fall risk sign on door

## 2023-03-24 NOTE — ED Notes (Signed)
 Signature pad was not working in ED room, verbal AMA signature obtained  Primary nurse explained to pt she is leaving AMA which means against medical advice and her condition could worsen and be life threatening. Pt expressed understanding. Pt was told to go to [facility] as soon as possible, pt expressed understanding

## 2023-03-24 NOTE — Discharge Instructions (Signed)
 Please return to [facility] emergency department if you change your mind about admission and care/after you get things done that you need to get done.

## 2023-03-24 NOTE — ED Triage Notes (Addendum)
 States 3 weeks ago had abdominal pain and then abdominal distention. 2 days ago started having bilateral lower extremity edema. Dyspnea on exertion.   States decreased appetite and lost a lot of weight over the past 6 months. Daily alcohol drinker, 2-3 liquor drinks daily.

## 2023-03-25 ENCOUNTER — Encounter (HOSPITAL_COMMUNITY): Payer: Self-pay | Admitting: Emergency Medicine

## 2023-03-25 ENCOUNTER — Inpatient Hospital Stay (HOSPITAL_COMMUNITY)
Admission: EM | Admit: 2023-03-25 | Discharge: 2023-03-27 | DRG: 432 | Discharge status: Against Medical Advice | Payer: 59 | Attending: Internal Medicine | Admitting: Internal Medicine

## 2023-03-25 ENCOUNTER — Other Ambulatory Visit: Payer: Self-pay

## 2023-03-25 ENCOUNTER — Emergency Department (HOSPITAL_COMMUNITY): Payer: 59

## 2023-03-25 ENCOUNTER — Inpatient Hospital Stay (HOSPITAL_COMMUNITY): Payer: 59

## 2023-03-25 DIAGNOSIS — K7460 Unspecified cirrhosis of liver: Principal | ICD-10-CM

## 2023-03-25 DIAGNOSIS — K860 Alcohol-induced chronic pancreatitis: Secondary | ICD-10-CM | POA: Diagnosis present

## 2023-03-25 DIAGNOSIS — I959 Hypotension, unspecified: Secondary | ICD-10-CM | POA: Diagnosis present

## 2023-03-25 DIAGNOSIS — E8770 Fluid overload, unspecified: Secondary | ICD-10-CM | POA: Diagnosis present

## 2023-03-25 DIAGNOSIS — K766 Portal hypertension: Secondary | ICD-10-CM | POA: Diagnosis present

## 2023-03-25 DIAGNOSIS — E871 Hypo-osmolality and hyponatremia: Secondary | ICD-10-CM | POA: Diagnosis present

## 2023-03-25 DIAGNOSIS — K3189 Other diseases of stomach and duodenum: Secondary | ICD-10-CM | POA: Diagnosis present

## 2023-03-25 DIAGNOSIS — K219 Gastro-esophageal reflux disease without esophagitis: Secondary | ICD-10-CM | POA: Diagnosis present

## 2023-03-25 DIAGNOSIS — Z803 Family history of malignant neoplasm of breast: Secondary | ICD-10-CM

## 2023-03-25 DIAGNOSIS — K921 Melena: Secondary | ICD-10-CM | POA: Diagnosis present

## 2023-03-25 DIAGNOSIS — K767 Hepatorenal syndrome: Secondary | ICD-10-CM | POA: Diagnosis present

## 2023-03-25 DIAGNOSIS — K7030 Alcoholic cirrhosis of liver without ascites: Secondary | ICD-10-CM | POA: Diagnosis present

## 2023-03-25 DIAGNOSIS — I129 Hypertensive chronic kidney disease with stage 1 through stage 4 chronic kidney disease, or unspecified chronic kidney disease: Secondary | ICD-10-CM | POA: Diagnosis present

## 2023-03-25 DIAGNOSIS — D500 Iron deficiency anemia secondary to blood loss (chronic): Secondary | ICD-10-CM | POA: Diagnosis not present

## 2023-03-25 DIAGNOSIS — Z8249 Family history of ischemic heart disease and other diseases of the circulatory system: Secondary | ICD-10-CM

## 2023-03-25 DIAGNOSIS — Z5329 Procedure and treatment not carried out because of patient's decision for other reasons: Secondary | ICD-10-CM | POA: Diagnosis present

## 2023-03-25 DIAGNOSIS — E8809 Other disorders of plasma-protein metabolism, not elsewhere classified: Secondary | ICD-10-CM | POA: Diagnosis present

## 2023-03-25 DIAGNOSIS — K7031 Alcoholic cirrhosis of liver with ascites: Secondary | ICD-10-CM | POA: Diagnosis present

## 2023-03-25 DIAGNOSIS — D539 Nutritional anemia, unspecified: Secondary | ICD-10-CM | POA: Diagnosis present

## 2023-03-25 DIAGNOSIS — Z7984 Long term (current) use of oral hypoglycemic drugs: Secondary | ICD-10-CM | POA: Diagnosis not present

## 2023-03-25 DIAGNOSIS — E1122 Type 2 diabetes mellitus with diabetic chronic kidney disease: Secondary | ICD-10-CM | POA: Diagnosis present

## 2023-03-25 DIAGNOSIS — E876 Hypokalemia: Secondary | ICD-10-CM | POA: Diagnosis present

## 2023-03-25 DIAGNOSIS — R188 Other ascites: Principal | ICD-10-CM

## 2023-03-25 DIAGNOSIS — E119 Type 2 diabetes mellitus without complications: Secondary | ICD-10-CM | POA: Diagnosis not present

## 2023-03-25 DIAGNOSIS — Z79899 Other long term (current) drug therapy: Secondary | ICD-10-CM

## 2023-03-25 DIAGNOSIS — R140 Abdominal distension (gaseous): Secondary | ICD-10-CM

## 2023-03-25 DIAGNOSIS — N179 Acute kidney failure, unspecified: Secondary | ICD-10-CM | POA: Diagnosis present

## 2023-03-25 DIAGNOSIS — F10230 Alcohol dependence with withdrawal, uncomplicated: Secondary | ICD-10-CM | POA: Diagnosis present

## 2023-03-25 DIAGNOSIS — E872 Acidosis, unspecified: Secondary | ICD-10-CM | POA: Diagnosis present

## 2023-03-25 DIAGNOSIS — Z7982 Long term (current) use of aspirin: Secondary | ICD-10-CM

## 2023-03-25 DIAGNOSIS — M7989 Other specified soft tissue disorders: Secondary | ICD-10-CM | POA: Diagnosis not present

## 2023-03-25 DIAGNOSIS — R9431 Abnormal electrocardiogram [ECG] [EKG]: Secondary | ICD-10-CM | POA: Diagnosis present

## 2023-03-25 DIAGNOSIS — Z833 Family history of diabetes mellitus: Secondary | ICD-10-CM

## 2023-03-25 DIAGNOSIS — E1165 Type 2 diabetes mellitus with hyperglycemia: Secondary | ICD-10-CM | POA: Diagnosis present

## 2023-03-25 DIAGNOSIS — Z87891 Personal history of nicotine dependence: Secondary | ICD-10-CM

## 2023-03-25 LAB — COMPREHENSIVE METABOLIC PANEL
ALT: 33 U/L (ref 0–44)
AST: 96 U/L — ABNORMAL HIGH (ref 15–41)
Albumin: 2.2 g/dL — ABNORMAL LOW (ref 3.5–5.0)
Alkaline Phosphatase: 228 U/L — ABNORMAL HIGH (ref 38–126)
Anion gap: 15 (ref 5–15)
BUN: 18 mg/dL (ref 8–23)
CO2: 16 mmol/L — ABNORMAL LOW (ref 22–32)
Calcium: 7.5 mg/dL — ABNORMAL LOW (ref 8.9–10.3)
Chloride: 98 mmol/L (ref 98–111)
Creatinine, Ser: 1.76 mg/dL — ABNORMAL HIGH (ref 0.44–1.00)
GFR, Estimated: 32 mL/min — ABNORMAL LOW (ref >60)
Glucose, Bld: 370 mg/dL — ABNORMAL HIGH (ref 70–99)
Potassium: 3.4 mmol/L — ABNORMAL LOW (ref 3.5–5.1)
Sodium: 129 mmol/L — ABNORMAL LOW (ref 135–145)
Total Bilirubin: 3.7 mg/dL — ABNORMAL HIGH (ref 0.0–1.2)
Total Protein: 6.2 g/dL — ABNORMAL LOW (ref 6.5–8.1)

## 2023-03-25 LAB — I-STAT CG4 LACTIC ACID, ED
Lactic Acid, Venous: 4.9 mmol/L (ref 0.5–1.9)
Lactic Acid, Venous: 4.4 mmol/L (ref 0.5–1.9)

## 2023-03-25 LAB — ETHANOL: Alcohol, Ethyl (B): <10 mg/dL (ref <10)

## 2023-03-25 LAB — BLOOD GAS, VENOUS
Acid-base deficit: 6.2 mmol/L — ABNORMAL HIGH (ref 0.0–2.0)
Bicarbonate: 19 mmol/L — ABNORMAL LOW (ref 20.0–28.0)
O2 Saturation: 24.3 %
Patient temperature: 37
pCO2, Ven: 36 mm[Hg] — ABNORMAL LOW (ref 44–60)
pH, Ven: 7.33 (ref 7.25–7.43)
pO2, Ven: <31 mm[Hg] — CL (ref 32–45)

## 2023-03-25 LAB — RAPID URINE DRUG SCREEN, HOSP PERFORMED
Amphetamines: NOT DETECTED
Barbiturates: NOT DETECTED
Benzodiazepines: NOT DETECTED
Cocaine: NOT DETECTED
Opiates: NOT DETECTED
Tetrahydrocannabinol: NOT DETECTED

## 2023-03-25 LAB — CBC WITH DIFFERENTIAL/PLATELET
Abs Immature Granulocytes: 0.08 10*3/uL — ABNORMAL HIGH (ref 0.00–0.07)
Basophils Absolute: 0 10*3/uL (ref 0.0–0.1)
Basophils Relative: 0 %
Eosinophils Absolute: 0 10*3/uL (ref 0.0–0.5)
Eosinophils Relative: 0 %
HCT: 27.4 % — ABNORMAL LOW (ref 36.0–46.0)
Hemoglobin: 9.3 g/dL — ABNORMAL LOW (ref 12.0–15.0)
Immature Granulocytes: 1 %
Lymphocytes Relative: 8 %
Lymphs Abs: 0.9 10*3/uL (ref 0.7–4.0)
MCH: 36.2 pg — ABNORMAL HIGH (ref 26.0–34.0)
MCHC: 33.9 g/dL (ref 30.0–36.0)
MCV: 106.6 fL — ABNORMAL HIGH (ref 80.0–100.0)
Monocytes Absolute: 0.8 10*3/uL (ref 0.1–1.0)
Monocytes Relative: 6 %
Neutro Abs: 10.7 10*3/uL — ABNORMAL HIGH (ref 1.7–7.7)
Neutrophils Relative %: 85 %
Platelets: 274 10*3/uL (ref 150–400)
RBC: 2.57 MIL/uL — ABNORMAL LOW (ref 3.87–5.11)
RDW: 16 % — ABNORMAL HIGH (ref 11.5–15.5)
WBC: 12.5 10*3/uL — ABNORMAL HIGH (ref 4.0–10.5)
nRBC: 0 % (ref 0.0–0.2)

## 2023-03-25 LAB — PROTIME-INR
INR: 1.7 — ABNORMAL HIGH (ref 0.8–1.2)
Prothrombin Time: 20.1 s — ABNORMAL HIGH (ref 11.4–15.2)

## 2023-03-25 LAB — AMMONIA: Ammonia: 42 umol/L — ABNORMAL HIGH (ref 9–35)

## 2023-03-25 LAB — CBG MONITORING, ED: Glucose-Capillary: 278 mg/dL — ABNORMAL HIGH (ref 70–99)

## 2023-03-25 MED ORDER — IOHEXOL 300 MG/ML  SOLN
80.0000 mL | Freq: Once | INTRAMUSCULAR | Status: AC | PRN
  Administered 2023-03-25: 80 mL via INTRAVENOUS

## 2023-03-25 MED ORDER — INSULIN ASPART 100 UNIT/ML IJ SOLN
0.0000 [IU] | Freq: Three times a day (TID) | INTRAMUSCULAR | Status: DC
Start: 2023-03-26 — End: 2023-03-27
  Administered 2023-03-26: 2 [IU] via SUBCUTANEOUS
  Administered 2023-03-26 (×2): 3 [IU] via SUBCUTANEOUS
  Administered 2023-03-27: 7 [IU] via SUBCUTANEOUS
  Filled 2023-03-25: qty 0.09

## 2023-03-25 MED ORDER — ADULT MULTIVITAMIN W/MINERALS CH
1.0000 | ORAL_TABLET | Freq: Every day | ORAL | Status: DC
  Administered 2023-03-25 – 2023-03-26 (×2): 1 via ORAL
  Filled 2023-03-25 (×3): qty 1

## 2023-03-25 MED ORDER — LORAZEPAM 2 MG/ML IJ SOLN
1.0000 mg | INTRAMUSCULAR | Status: DC | PRN
Start: 2023-03-25 — End: 2023-03-28

## 2023-03-25 MED ORDER — FOLIC ACID 1 MG PO TABS
1.0000 mg | ORAL_TABLET | Freq: Every day | ORAL | Status: DC
  Administered 2023-03-26: 1 mg via ORAL
  Filled 2023-03-25 (×2): qty 1

## 2023-03-25 MED ORDER — THIAMINE MONONITRATE 100 MG PO TABS
100.0000 mg | ORAL_TABLET | Freq: Every day | ORAL | Status: DC
  Administered 2023-03-25 – 2023-03-27 (×3): 100 mg via ORAL
  Filled 2023-03-25 (×3): qty 1

## 2023-03-25 MED ORDER — INSULIN ASPART 100 UNIT/ML IJ SOLN
0.0000 [IU] | Freq: Every day | INTRAMUSCULAR | Status: DC
  Administered 2023-03-25: 3 [IU] via SUBCUTANEOUS
  Filled 2023-03-25: qty 0.05

## 2023-03-25 MED ORDER — THIAMINE HCL 100 MG/ML IJ SOLN
100.0000 mg | Freq: Every day | INTRAMUSCULAR | Status: DC
  Filled 2023-03-25 (×3): qty 2

## 2023-03-25 MED ORDER — LORAZEPAM 1 MG PO TABS: 1.0000 mg | ORAL_TABLET | ORAL | Status: DC | PRN

## 2023-03-25 NOTE — ED Triage Notes (Signed)
 Pt here from home , left ama fro [facility] yesterday , had workup for ascites and needed follow up with GI , pt back to admitted today

## 2023-03-25 NOTE — Progress Notes (Signed)
 Patient's hospital computer chart in our office computer chart reviewed and unfortunately she is in CT now and her case was discussed with the ER physician and you may want to run her case by hematology but she probably needs anticoagulation for her portal vein thrombosis unless signs of GI blood loss and I am unsure if a CT without contrast will help confirm that is what she has and she may need an MRI to confirm and I will see either later today or tomorrow and please call sooner if any specific GI question or problem

## 2023-03-25 NOTE — Inpatient Diabetes Management (Signed)
 Inpatient Diabetes Program Recommendations  AACE/ADA: New Consensus Statement on Inpatient Glycemic Control (2015)  Target Ranges:  Prepandial:   less than 140 mg/dL      Peak postprandial:   less than 180 mg/dL (1-2 hours)      Critically ill patients:  140 - 180 mg/dL   Lab Results  Component Value Date   GLUCAP 130 (H) 05/12/2017    Review of Glycemic Control  Latest Reference Range & Units 03/25/23 11:59  Glucose 70 - 99 mg/dL 629 (H)   Diabetes history: DM 2 Outpatient Diabetes medications: tradjenta 5 mg Daily, Metformin 500 mg Daily Current orders for Inpatient glycemic control:  None being evaluated in the ED  Inpatient Diabetes Program Recommendations:    elevated renal function, some lactic acidosis (on metformin outpt)  -   consider Novolog  0-9 units Q4 if NPO or tid + hs if diet to be ordered.   May need addition of low dose basal insulin  after observing response of Novolog  to glucose trends.  Thanks,  Clotilda Bull RN, MSN, BC-ADM Inpatient Diabetes Coordinator Team Pager [phone] (8a-5p)

## 2023-03-25 NOTE — ED Provider Notes (Signed)
 [place] EMERGENCY DEPARTMENT AT [facility] Provider Note   CSN: [id number] Arrival date & time: 03/25/23  1011     History  Chief Complaint  Patient presents with   Abdominal Pain    Chelsea Ross is a 63 y.o. female.  63 year old female with history of diabetes and hypertension and chronic alcohol use presenting for progressive abdominal distention for 3 months with new onset leg swelling x 2-3 days.  Patient presented to med [facility] yesterday and was found to have evidence of cirrhosis with portal vein thrombosis.  At that time admission with GI evaluation was recommended; however, the patient left AMA and now presents to [facility], ED.  Patient denies any worsening of symptoms since yesterday, denies any abdominal pain, nausea, vomiting, fevers, constipation, diarrhea.  However, states that she is comfortable moving forward with any necessary procedures and admission.    Abdominal Pain Associated symptoms: no chest pain, no constipation, no cough, no diarrhea, no dysuria, no fever, no hematuria, no nausea, no shortness of breath and no vomiting        Home Medications Prior to Admission medications   Medication Sig Start Date End Date Taking? Authorizing Provider  aspirin  EC 81 MG tablet Take 1 tablet (81 mg total) by mouth daily. Swallow whole. 02/20/21   Cantwell, Celeste C, PA-C  glipiZIDE (GLUCOTROL) 5 MG tablet Take 5 mg by mouth daily. 09/07/20   [provider]  linagliptin (TRADJENTA) 5 MG TABS tablet Take 1 tablet by mouth daily. 07/04/20   [provider]  lipase/protease/amylase (CREON ) 36000 UNITS CPEP capsule Take by mouth.    [provider]  losartan (COZAAR) 25 MG tablet Take 25 mg by mouth every morning.    [provider]  magnesium  oxide (MAG-OX) 400 MG tablet Take 800-1,200 mg by mouth daily. Patient not taking: Reported on 02/20/2021    [provider]  OneTouch Delica Lancets 30G MISC  1 kit by Misc.(Non-Drug; Combo Route) route daily. 07/30/19   [provider]      Allergies    Patient has no known allergies.    Review of Systems   Review of Systems  Constitutional:  Negative for fever.  Respiratory:  Negative for cough and shortness of breath.   Cardiovascular:  Positive for leg swelling. Negative for chest pain.  Gastrointestinal:  Positive for abdominal distention. Negative for abdominal pain, constipation, diarrhea, nausea and vomiting.  Genitourinary:  Negative for dysuria and hematuria.  Neurological:  Negative for weakness, light-headedness, numbness and headaches.  Psychiatric/Behavioral:  Negative for confusion.     Physical Exam Updated Vital Signs BP 108/60 (BP Location: Right Arm)   Pulse 83   Temp (!) 97.1 F (36.2 C) (Oral)   Resp 16   SpO2 100%  Physical Exam Constitutional:      General: She is not in acute distress. HENT:     Head: Normocephalic and atraumatic.  Eyes:     Extraocular Movements: Extraocular movements intact.     Pupils: Pupils are equal, round, and reactive to light.  Cardiovascular:     Rate and Rhythm: Normal rate and regular rhythm.     Heart sounds: Normal heart sounds. No murmur heard. Pulmonary:     Effort: Pulmonary effort is normal. No respiratory distress.     Breath sounds: Normal breath sounds. No rales.  Abdominal:     General: There is distension.     Tenderness: There is no abdominal tenderness.  Comments: Firm  Musculoskeletal:     Right lower leg: 2+ Pitting Edema present.     Left lower leg: 2+ Pitting Edema present.  Neurological:     Mental Status: She is alert.     ED Results / Procedures / Treatments   Labs (all labs ordered are listed, but only abnormal results are displayed) Labs Reviewed  CBC WITH DIFFERENTIAL/PLATELET - Abnormal; Notable for the following components:      Result Value   WBC 12.5 (*)    RBC 2.57 (*)    Hemoglobin 9.3 (*)    HCT 27.4 (*)    MCV 106.6  (*)    MCH 36.2 (*)    RDW 16.0 (*)    Neutro Abs 10.7 (*)    Abs Immature Granulocytes 0.08 (*)    All other components within normal limits  BLOOD GAS, VENOUS - Abnormal; Notable for the following components:   pCO2, Ven 36 (*)    pO2, Ven <31 (*)    Bicarbonate 19.0 (*)    Acid-base deficit 6.2 (*)    All other components within normal limits  I-STAT CG4 LACTIC ACID, ED - Abnormal; Notable for the following components:   Lactic Acid, Venous 4.9 (*)    All other components within normal limits  COMPREHENSIVE METABOLIC PANEL  PROTIME-INR  ETHANOL    EKG EKG Interpretation Date/Time:  Tuesday March 25 2023 11:15:36 EST Ventricular Rate:  100 PR Interval:  114 QRS Duration:  78 QT Interval:  436 QTC Calculation: 563 R Axis:   40  Text Interpretation: Sinus tachycardia Abnormal R-wave progression, early transition Nonspecific T abnrm, anterolateral leads Prolonged QT interval when compared top rior, similar appearance. No STEMI Confirmed by Ginger Barefoot ([id number]) on 03/25/2023 11:25:43 AM  Radiology US  Abdomen Limited RUQ (LIVER/GB) Addendum Date: 03/24/2023 ADDENDUM REPORT: 03/24/2023 09:25 ADDENDUM: Study discussed by telephone with Dr. JULIENE BICKER on 03/24/2023 at 0920 hours. Electronically Signed   By: VEAR Hurst M.D.   On: 03/24/2023 09:25   Result Date: 03/24/2023 CLINICAL DATA:  63 year old female with abdominal swelling. EXAM: ULTRASOUND ABDOMEN LIMITED RIGHT UPPER QUADRANT COMPARISON:  Abdomen MRI 07/08/2016.  Chest CT 08/22/2021. FINDINGS: Gallbladder: Gallbladder wall thickening in the presence of ascites (image 7). No sludge or stones identified within the lumen. No sonographic Murphy sign elicited. Common bile duct: Diameter: 3 mm, normal. Liver: Chronic hepatic steatosis. Echogenic liver and coarse hepatic echotexture. Nodular liver contour (image 43). No discrete liver lesion by ultrasound. No main portal vein flow is detected with color Doppler (image 51). Other:  Right upper quadrant ascites, fluid volume appears at least moderate (image 40). This is new since 2023. Nonobstructed visible right kidney. IMPRESSION: Chronic Hepatic steatosis with evidence now of Cirrhosis, PORTAL VEIN THROMBOSIS, and right upper quadrant ascites which is new since 2023. Electronically Signed: By: VEAR Hurst M.D. On: 03/24/2023 09:11   DG Chest Portable 1 View Result Date: 03/24/2023 CLINICAL DATA:  Leg swelling. EXAM: PORTABLE CHEST 1 VIEW COMPARISON:  None Available. FINDINGS: Bilateral lung fields are clear. No pulmonary embolism. Bilateral costophrenic angles are clear. Normal cardio-mediastinal silhouette. No acute osseous abnormalities. The soft tissues are within normal limits. IMPRESSION: *No acute cardiopulmonary abnormality. Electronically Signed   By: Ree Molt M.D.   On: 03/24/2023 08:39    Procedures Procedures    Medications Ordered in ED Medications - No data to display  ED Course/ Medical Decision Making/ A&P  Medical Decision Making 63 year old female with PMH including diabetes and hypertension presenting for progressive abdominal swelling for 3 months and bilateral lower extremity edema for 2 days.  Presented to [facility] yesterday found to have cirrhosis with portal vein thrombosis.  At that time also had elevated lactate but no concern for SBP.  GI was consulted and recommended medical admission and further evaluation.  However, patient left AMA at that time.  Currently, exam consistent with ascites and lower extremity edema likely 2/2 her cirrhosis in the setting of chronic alcohol use. Will obtain DVT ultrasound given portal vein thrombosis noted yesterday but lower suspicion for this. Low suspicion for SBP given afebrile, no abdominal pain, only very slight leukocytosis yesterday, downtrending lactate (4.9 from 6.2 yesterday).  Plan to obtain repeat bloodwork and consult GI, likely admit to medicine  service.  12:29 PM Spoke with GI - they recommend medical admission and will evaluate the patient. Also recommend CT w contrast to confirm thrombosis. Pt with AKI but GFR > 30 so will obtain this.  1:00 PM Spoke with hospitalist who will admit the patient. Ordered UDS, blood cultures, and ammonia level per their request  Amount and/or Complexity of Data Reviewed Labs: ordered. Radiology: ordered.           Final Clinical Impression(s) / ED Diagnoses Final diagnoses:  None    Rx / DC Orders ED Discharge Orders     None         Romelle Booty, MD 03/25/23 1300    Tegeler, Lonni PARAS, MD 03/27/23 [phone]

## 2023-03-25 NOTE — ED Notes (Signed)
 IV access attempted x2 without success in an attempt to draw cultures. I will try to straight stick to see if that is more successful.

## 2023-03-25 NOTE — Consult Note (Signed)
 Reason for Consult: Ascites Referring Physician: Hospital team  Chelsea Ross is an 63 y.o. female.  HPI: Patient seen and examined in both her hospital computer chart and our office computer chart was reviewed and she does not remember being admitted to the hospital before and she says about 3 months ago she had some midepigastric discomfort but that went away and she is not on any aspirin  or nonsteroidals or Tylenol  but she has been having some increased abdominal girth and bloating and 2 days ago her feet began to swell and she has continued to drink and is not aware she had pancreatitis in the past and her family history is negative for any liver or pancreas or GI problems and her last colonoscopy was 2016 and she may have seen some black stools a few days ago but has not seen any other blood in her bowels or had any lower bowel complaints and she has no other complaints  Past Medical History:  Diagnosis Date   Diabetes mellitus without complication (HCC)    GERD (gastroesophageal reflux disease)    Renal disorder     Past Surgical History:  Procedure Laterality Date   MASS EXCISION Left 05/12/2017   Procedure: EXCISION MASS LEFT RING FINGER;  Surgeon: Murrell Drivers, MD;  Location: [facility];  Service: Orthopedics;  Laterality: Left;    Family History  Problem Relation Age of Onset   Cancer Mother    Breast cancer Mother 4       mother had breast cancer 3 times   Hypertension Father    Diabetes Father     Social History:  reports that she has quit smoking. Her smoking use included cigarettes. She has a 60 pack-year smoking history. She has never used smokeless tobacco. She reports current alcohol use. She reports that she does not use drugs.  Allergies: No Known Allergies  Medications: I have reviewed the patient's current medications.  Results for orders placed or performed during the hospital encounter of 03/25/23 (from the past 48 hours)  Comprehensive metabolic  panel     Status: Abnormal   Collection Time: 03/25/23 11:59 AM  Result Value Ref Range   Sodium 129 (L) 135 - 145 mmol/L   Potassium 3.4 (L) 3.5 - 5.1 mmol/L   Chloride 98 98 - 111 mmol/L   CO2 16 (L) 22 - 32 mmol/L   Glucose, Bld 370 (H) 70 - 99 mg/dL    Comment: Glucose reference range applies only to samples taken after fasting for at least 8 hours.   BUN 18 8 - 23 mg/dL   Creatinine, Ser 8.23 (H) 0.44 - 1.00 mg/dL   Calcium 7.5 (L) 8.9 - 10.3 mg/dL   Total Protein 6.2 (L) 6.5 - 8.1 g/dL   Albumin  2.2 (L) 3.5 - 5.0 g/dL   AST 96 (H) 15 - 41 U/L   ALT 33 0 - 44 U/L   Alkaline Phosphatase 228 (H) 38 - 126 U/L   Total Bilirubin 3.7 (H) 0.0 - 1.2 mg/dL   GFR, Estimated 32 (L) >60 mL/min    Comment: (NOTE) Calculated using the CKD-EPI Creatinine Equation (2021)    Anion gap 15 5 - 15    Comment: Performed at [facility], [address] [place], [place] [address]  CBC with Differential     Status: Abnormal   Collection Time: 03/25/23 11:59 AM  Result Value Ref Range   WBC 12.5 (H) 4.0 - 10.5 K/uL  RBC 2.57 (L) 3.87 - 5.11 MIL/uL   Hemoglobin 9.3 (L) 12.0 - 15.0 g/dL   HCT 72.5 (L) 63.9 - 53.9 %   MCV 106.6 (H) 80.0 - 100.0 fL   MCH 36.2 (H) 26.0 - 34.0 pg   MCHC 33.9 30.0 - 36.0 g/dL   RDW 83.9 (H) 88.4 - 84.4 %   Platelets 274 150 - 400 K/uL   nRBC 0.0 0.0 - 0.2 %   Neutrophils Relative % 85 %   Neutro Abs 10.7 (H) 1.7 - 7.7 K/uL   Lymphocytes Relative 8 %   Lymphs Abs 0.9 0.7 - 4.0 K/uL   Monocytes Relative 6 %   Monocytes Absolute 0.8 0.1 - 1.0 K/uL   Eosinophils Relative 0 %   Eosinophils Absolute 0.0 0.0 - 0.5 K/uL   Basophils Relative 0 %   Basophils Absolute 0.0 0.0 - 0.1 K/uL   Immature Granulocytes 1 %   Abs Immature Granulocytes 0.08 (H) 0.00 - 0.07 K/uL    Comment: Performed at [facility], [address], [place], [place] [address]  Protime-INR     Status: Abnormal   Collection Time: 03/25/23 11:59 AM  Result  Value Ref Range   Prothrombin Time 20.1 (H) 11.4 - 15.2 seconds   INR 1.7 (H) 0.8 - 1.2    Comment: (NOTE) INR goal varies based on device and disease states. Performed at [facility], [address], [place], [place] [address]   Blood gas, venous     Status: Abnormal   Collection Time: 03/25/23 11:59 AM  Result Value Ref Range   pH, Ven 7.33 7.25 - 7.43   pCO2, Ven 36 (L) 44 - 60 mmHg   pO2, Ven <31 (LL) 32 - 45 mmHg    Comment: CRITICAL RESULT CALLED TO, READ BACK BY AND VERIFIED WITH: EUSTACE HUSK, RN @1222  03/25/23 BY VIN    Bicarbonate 19.0 (L) 20.0 - 28.0 mmol/L   Acid-base deficit 6.2 (H) 0.0 - 2.0 mmol/L   O2 Saturation 24.3 %   Patient temperature 37.0     Comment: Performed at [facility], [address], [place], [place] [address]  Ethanol     Status: None   Collection Time: 03/25/23 11:59 AM  Result Value Ref Range   Alcohol, Ethyl (B) <10 <10 mg/dL    Comment: (NOTE) Lowest detectable limit for serum alcohol is 10 mg/dL.  For medical purposes only. Performed at [facility], [address], [place], [place] [address]   I-Stat Lactic Acid     Status: Abnormal   Collection Time: 03/25/23 12:12 PM  Result Value Ref Range   Lactic Acid, Venous 4.9 (HH) 0.5 - 1.9 mmol/L   Comment NOTIFIED PHYSICIAN   I-Stat Lactic Acid     Status: Abnormal   Collection Time: 03/25/23  2:31 PM  Result Value Ref Range   Lactic Acid, Venous 4.4 (HH) 0.5 - 1.9 mmol/L   Comment NOTIFIED PHYSICIAN     CT ABDOMEN PELVIS W CONTRAST Result Date: 03/25/2023 CLINICAL DATA:  Portal vein thrombosis ascites, portal vein thrombosis. EXAM: CT ABDOMEN AND PELVIS WITH CONTRAST TECHNIQUE: Multidetector CT imaging of the abdomen and pelvis was performed using the standard protocol following bolus administration of intravenous contrast. RADIATION DOSE REDUCTION: This exam was performed according to the departmental dose-optimization program  which includes automated exposure control, adjustment of the mA and/or kV according to patient size and/or use of iterative reconstruction technique. CONTRAST:  80mL OMNIPAQUE  IOHEXOL  300 MG/ML  SOLN COMPARISON:  MRI abdomen from 07/08/2016. FINDINGS: Lower chest: The lung bases are clear. No pleural effusion. The heart is normal in size. No pericardial effusion. Hepatobiliary: The liver is normal in size. There is diffuse heterogeneous attenuation of the liver due to ill-defined infiltrating hypoattenuating areas. Liver surface is predominantly smooth however, there is subtle nodularity along the surface of left hepatic lobe, segment 3/4B. There is external mass effect on the hepatic veins and portal veins, which are diminutive in size however, known distortion of the veins. No intrahepatic or extrahepatic bile duct dilation. Physiologically distended gallbladder. No calcified gallstones. No abnormal wall thickening. No pericholecystic fat stranding. Pancreas: The pancreas is atrophic and exhibit dense dystrophic calcifications, asymmetrically involving the pancreatic head/uncinate process. There is mild dilation of main pancreatic duct mainly in the body region. There are several calcifications which are likely within the main pancreatic duct. Findings represent sequela of chronic pancreatitis. No surrounding fat stranding to suggest superimposed acute pancreatitis. No suspicious lesion seen. Spleen: Within normal limits. No focal lesion. Adrenals/Urinary Tract: Adrenal glands are unremarkable. There are several simple cysts in bilateral kidneys with largest in the right kidney lower pole, laterally measuring up to 0.9 x 1.3 cm. No nephroureterolithiasis or obstructive uropathy. Urinary bladder is under distended, precluding optimal assessment. However, no large mass or stones identified. No perivesical fat stranding. Stomach/Bowel: No disproportionate dilation of the small or large bowel loops. Unremarkable  appendix. There is mild-to-moderate circumferential thickening of the ascending colon and hepatic flexure colon with mild pericolonic fat stranding/prominence of vasa recta, likely secondary to portal colopathy. However, differential diagnosis also includes right-sided colitis. Correlate clinically. Vascular/Lymphatic: There is moderate to large ascites. There is nodularity in the omentum, likely related to ascites as well. No pneumoperitoneum. No abdominal or pelvic lymphadenopathy, by size criteria. No aneurysmal dilation of the major abdominal arteries. There are moderate peripheral atherosclerotic vascular calcifications of the aorta and its major branches. Reproductive: The uterus is unremarkable. No large adnexal mass. Other: There is a tiny fat containing umbilical hernia. There is mild anasarca. Musculoskeletal: No suspicious osseous lesions. There are mild multilevel degenerative changes in the visualized spine. IMPRESSION: 1. There is diffuse heterogeneous attenuation of the liver with diminutive but patent portal and hepatic veins. Findings are concerning for steatohepatitis (masslike steatosis). Correlate clinically and with liver function tests. 2. Findings favor sequela of chronic pancreatitis, as described above. 3. Multiple other nonacute observations (such as simple cysts in bilateral kidneys, right hemicolon apparent wall thickening, moderate-to-large ascites, etc.), as described above. Electronically Signed   By: Ree Molt M.D.   On: 03/25/2023 14:02   VAS US  LOWER EXTREMITY VENOUS (DVT) (ONLY [facility] & [facility]) Result Date: 03/25/2023  Lower Venous DVT Study Patient Name:  Chelsea Ross  Date of Exam:   03/25/2023 Medical Rec #: [id number]     Accession #:    [id number] Date of Birth: 1960-04-20     Patient Gender: F Patient Age:   44 years Exam Location:  [facility] Procedure:      VAS US  LOWER EXTREMITY VENOUS (DVT) Referring Phys: LONNI SAKAI  --------------------------------------------------------------------------------  Indications: Swelling, and Edema.  Risk Factors: None identified. Comparison Study: None. Performing Technologist: Garnette Rockers  Examination Guidelines: A complete evaluation includes B-mode imaging, spectral Doppler, color Doppler, and power Doppler as needed of all accessible portions of each vessel. Bilateral testing is considered an integral part of a complete examination. Limited examinations for reoccurring indications  may be performed as noted. The reflux portion of the exam is performed with the patient in reverse Trendelenburg.  +---------+---------------+---------+-----------+----------+--------------+ RIGHT    CompressibilityPhasicitySpontaneityPropertiesThrombus Aging +---------+---------------+---------+-----------+----------+--------------+ CFV      Full           Yes      Yes                                 +---------+---------------+---------+-----------+----------+--------------+ SFJ      Full                                                        +---------+---------------+---------+-----------+----------+--------------+ FV Prox  Full                                                        +---------+---------------+---------+-----------+----------+--------------+ FV Mid   Full                                                        +---------+---------------+---------+-----------+----------+--------------+ FV DistalFull                                                        +---------+---------------+---------+-----------+----------+--------------+ PFV      Full                                                        +---------+---------------+---------+-----------+----------+--------------+ POP      Full           Yes      Yes                                 +---------+---------------+---------+-----------+----------+--------------+ PTV      Full                                                         +---------+---------------+---------+-----------+----------+--------------+ PERO     Full                                                        +---------+---------------+---------+-----------+----------+--------------+   +---------+---------------+---------+-----------+----------+--------------+ LEFT     CompressibilityPhasicitySpontaneityPropertiesThrombus Aging +---------+---------------+---------+-----------+----------+--------------+ CFV      Full  Yes      Yes                                 +---------+---------------+---------+-----------+----------+--------------+ SFJ      Full                                                        +---------+---------------+---------+-----------+----------+--------------+ FV Prox  Full                                                        +---------+---------------+---------+-----------+----------+--------------+ FV Mid   Full                                                        +---------+---------------+---------+-----------+----------+--------------+ FV DistalFull                                                        +---------+---------------+---------+-----------+----------+--------------+ PFV      Full                                                        +---------+---------------+---------+-----------+----------+--------------+ POP      Full           Yes      Yes                                 +---------+---------------+---------+-----------+----------+--------------+ PTV      Full                                                        +---------+---------------+---------+-----------+----------+--------------+ PERO     Full                                                        +---------+---------------+---------+-----------+----------+--------------+     Summary: BILATERAL: - No evidence of deep vein thrombosis seen in the  lower extremities, bilaterally. -No evidence of popliteal cyst, bilaterally.   *See table(s) above for measurements and observations. Electronically signed by Norman Serve on 03/25/2023 at 1:29:11 PM.    Final    US  Abdomen Limited RUQ (LIVER/GB) Addendum Date: 03/24/2023 ADDENDUM REPORT: 03/24/2023 09:25 ADDENDUM: Study discussed by telephone with Dr. JULIENE BICKER  on 03/24/2023 at 0920 hours. Electronically Signed   By: VEAR Hurst M.D.   On: 03/24/2023 09:25   Result Date: 03/24/2023 CLINICAL DATA:  63 year old female with abdominal swelling. EXAM: ULTRASOUND ABDOMEN LIMITED RIGHT UPPER QUADRANT COMPARISON:  Abdomen MRI 07/08/2016.  Chest CT 08/22/2021. FINDINGS: Gallbladder: Gallbladder wall thickening in the presence of ascites (image 7). No sludge or stones identified within the lumen. No sonographic Murphy sign elicited. Common bile duct: Diameter: 3 mm, normal. Liver: Chronic hepatic steatosis. Echogenic liver and coarse hepatic echotexture. Nodular liver contour (image 43). No discrete liver lesion by ultrasound. No main portal vein flow is detected with color Doppler (image 51). Other: Right upper quadrant ascites, fluid volume appears at least moderate (image 40). This is new since 2023. Nonobstructed visible right kidney. IMPRESSION: Chronic Hepatic steatosis with evidence now of Cirrhosis, PORTAL VEIN THROMBOSIS, and right upper quadrant ascites which is new since 2023. Electronically Signed: By: VEAR Hurst M.D. On: 03/24/2023 09:11   DG Chest Portable 1 View Result Date: 03/24/2023 CLINICAL DATA:  Leg swelling. EXAM: PORTABLE CHEST 1 VIEW COMPARISON:  None Available. FINDINGS: Bilateral lung fields are clear. No pulmonary embolism. Bilateral costophrenic angles are clear. Normal cardio-mediastinal silhouette. No acute osseous abnormalities. The soft tissues are within normal limits. IMPRESSION: *No acute cardiopulmonary abnormality. Electronically Signed   By: Ree Molt M.D.   On: 03/24/2023 08:39     Review of Systems negative except above she does tell me she had a kidney problems in the past but that supposedly resolved but does not remember what medicine they treated her with Blood pressure 108/60, pulse 83, temperature (!) 97.1 F (36.2 C), temperature source Oral, resp. rate 16, SpO2 100%. Physical Exam vital signs stable afebrile no acute distress mild ascites nontender 2+ pitting edema ultrasound CT and labs reviewed creatinine increased liver tests increased hemoglobin slight decrease platelet count okay INR about the same  Assessment/Plan: Probable cirrhosis with some concerns over portal vein thrombosis however not demonstrated on CT Plan: Okay to hold off on blood thinner since CT did not confirm portal vein thrombosis and will allow a 2 g sodium diet and agree with paracentesis mostly for cell count cytology and albumin  and hopefully when creatinine better we can start diuretics and she might need an endoscopy at some point and consider checking with hematology to see if they MRI to confirm or rule out portal vein thrombosis is needed will check on tomorrow  [facility] E 03/25/2023, 3:08 PM

## 2023-03-25 NOTE — Progress Notes (Signed)
 Lower extremity venous duplex completed. Please see CV Procedures for preliminary results.  Shona Simpson, RVT 03/25/23 1:05 PM

## 2023-03-25 NOTE — H&P (Signed)
 History and Physical    Patient: Chelsea Ross FMW:[id number] DOB: 26-Nov-1960 DOA: 03/25/2023 DOS: the patient was seen and examined on 03/25/2023 PCP: Claudene Pellet, MD  Patient coming from: Home  Chief Complaint:  Chief Complaint  Patient presents with   Abdominal Pain   HPI: Chelsea Ross is a 63 y.o. female with medical history significant for but not limited to diabetes mellitus type 2, chronic pancreatitis, GERD as well as other comorbidities who is a daily alcohol drinker and drinks 2-3 drinks a day has been doing for several years and other comorbidities who presented to the ED for worsening abdominal distention and swelling as well as swelling of the legs which is also worsening for last few days.  She actually presented yesterday s to the ED for her similar complaints but left AGAINST MEDICAL ADVICE.  She was worked up at that time and had a right upper quad ultrasound and had an ultrasound which showed portal vein thrombosis as well as cirrhotic changes. She was found to have evidence of abdominal distention and concern for liver cirrhosis.  Subsequently she left AGAINST MEDICAL ADVICE.  She did not want to stay for further workup and tried to take care of things at home and come back to [facility] later.  Today is when she comes back for further evaluation.  Patient states that her abdomen has been distended and for the last 3 to 4 months and is just getting worse.  She states that they got uncomfortable to the point where she wanted to come in.  States her leg swelling has been progressively and worse for the last few weeks.  She denies any nausea vomiting, chest pain shortness of breath or lightheadedness or dizziness.  States that her legs feel tight and makes it difficult for her to ambulate a little bit.  No other concerns or complaints at this time and continues to drink alcohol and her last drink was yesterday.  Denies any other concerns or complaints and denies any leg weakness and  gastroenterology has been consulted for assistance with management of this patient.    Review of Systems: As mentioned in the history of present illness. All other systems reviewed and are negative. Past Medical History:  Diagnosis Date   Diabetes mellitus without complication (HCC)    GERD (gastroesophageal reflux disease)    Renal disorder    Past Surgical History:  Procedure Laterality Date   MASS EXCISION Left 05/12/2017   Procedure: EXCISION MASS LEFT RING FINGER;  Surgeon: Murrell Drivers, MD;  Location: [facility];  Service: Orthopedics;  Laterality: Left;   Social History:  reports that she has quit smoking. Her smoking use included cigarettes. She has a 60 pack-year smoking history. She has never used smokeless tobacco. She reports current alcohol use. She reports that she does not use drugs.  ALLERGIES No Known Allergies  Family History  Problem Relation Age of Onset   Cancer Mother    Breast cancer Mother 103       mother had breast cancer 3 times   Hypertension Father    Diabetes Father     Prior to Admission medications   Medication Sig Start Date End Date Taking? Authorizing Provider  linagliptin (TRADJENTA) 5 MG TABS tablet Take 1 tablet by mouth daily. 07/04/20  Yes [provider]  lipase/protease/amylase (CREON ) 36000 UNITS CPEP capsule Take 36,000 Units by mouth daily.   Yes [provider]  losartan (COZAAR) 50 MG tablet Take  25 mg by mouth daily.   Yes [provider]  magnesium  oxide (MAG-OX) 400 MG tablet Take 400 mg by mouth daily.   Yes [provider]  metFORMIN (GLUCOPHAGE) 500 MG tablet Take 500 mg by mouth daily.   Yes [provider]  potassium chloride  (KLOR-CON  M) 10 MEQ tablet Take 10 mEq by mouth 2 (two) times a week. 02/25/23  Yes [provider]  pantoprazole  (PROTONIX ) 40 MG tablet Take 40 mg by mouth daily. Patient not taking: Reported on 03/25/2023    [provider]    Physical Exam: Vitals:   03/25/23 1221 03/25/23 1658 03/25/23 1727 03/25/23 1948  BP: 108/60 104/66  105/70  Pulse: 83 (!) 106  (!) 109  Resp: 16 16  20   Temp: (!) 97.1 F (36.2 C)  98.3 F (36.8 C) 98 F (36.7 C)  TempSrc: Oral  Oral Oral  SpO2: 100% 99%  99%   Examination: Physical Exam:  Constitutional: Chronically ill-appearing Caucasian female who is extremely distended Respiratory: Diminished to auscultation bilaterally, no wheezing, rales, rhonchi or crackles. Normal respiratory effort and patient is not tachypenic. No accessory muscle use.  Unlabored breathing Cardiovascular: RRR, no murmurs / rubs / gallops. S1 and S2 auscultated.  Has 1-2+ lower extremity edema Abdomen: Soft, non-tender, distended secondary body habitus and has ascites and is hypertympanic. Bowel sounds positive.  GU: Deferred. Musculoskeletal: No clubbing / cyanosis of digits/nails. No joint deformity upper and lower extremities. Skin: No rashes, lesions, ulcers on medicine evaluation. No induration; Warm and dry.  Neurologic: CN 2-12 grossly intact with no focal deficits. Romberg sign cerebellar reflexes not assessed.  Psychiatric: Normal judgment and insight. Alert and oriented x 3. Normal mood and appropriate affect.   Data Reviewed: Recent Results (from the past 2160 hours)  Urinalysis, Routine w reflex microscopic -Urine, Clean Catch     Status: Abnormal   Collection Time: 02/26/23  8:26 AM  Result Value Ref Range   Color, Urine YELLOW YELLOW   APPearance CLEAR CLEAR   Specific Gravity, Urine 1.015 1.005 - 1.030   pH 5.5 5.0 - 8.0   Glucose, UA NEGATIVE NEGATIVE mg/dL   Hgb urine dipstick NEGATIVE NEGATIVE   Bilirubin Urine NEGATIVE NEGATIVE   Ketones, ur NEGATIVE NEGATIVE mg/dL   Protein, ur NEGATIVE NEGATIVE mg/dL   Nitrite NEGATIVE NEGATIVE   Leukocytes,Ua TRACE (A) NEGATIVE    Comment: Performed at [facility], [address] [place], [place] [address]  Urinalysis,  Microscopic (reflex)     Status: Abnormal   Collection Time: 02/26/23  8:26 AM  Result Value Ref Range   RBC / HPF 0-5 0 - 5 RBC/hpf   WBC, UA 0-5 0 - 5 WBC/hpf   Bacteria, UA RARE (A) NONE SEEN   Squamous Epithelial / HPF 0-5 0 - 5 /HPF    Comment: Performed at [facility], [address] [place], [place] [address]  CBC with Differential     Status: Abnormal   Collection Time: 02/26/23  8:58 AM  Result Value Ref Range   WBC 6.9 4.0 - 10.5 K/uL   RBC 2.72 (L) 3.87 - 5.11 MIL/uL   Hemoglobin 10.6 (L) 12.0 - 15.0 g/dL   HCT 69.4 (L) 63.9 - 53.9 %   MCV 112.1 (H) 80.0 - 100.0 fL   MCH 39.0 (H) 26.0 - 34.0 pg   MCHC 34.8 30.0 - 36.0 g/dL   RDW 85.5 88.4 - 84.4 %  Platelets 122 (L) 150 - 400 K/uL   nRBC 0.0 0.0 - 0.2 %   Neutrophils Relative % 67 %   Neutro Abs 4.6 1.7 - 7.7 K/uL   Lymphocytes Relative 20 %   Lymphs Abs 1.4 0.7 - 4.0 K/uL   Monocytes Relative 11 %   Monocytes Absolute 0.8 0.1 - 1.0 K/uL   Eosinophils Relative 1 %   Eosinophils Absolute 0.1 0.0 - 0.5 K/uL   Basophils Relative 1 %   Basophils Absolute 0.1 0.0 - 0.1 K/uL   WBC Morphology MORPHOLOGY UNREMARKABLE    Smear Review Normal platelet morphology    Immature Granulocytes 0 %   Abs Immature Granulocytes 0.02 0.00 - 0.07 K/uL    Comment: Performed at [facility], [address] [place], [place] [address]  Comprehensive metabolic panel     Status: Abnormal   Collection Time: 02/26/23  8:58 AM  Result Value Ref Range   Sodium 134 (L) 135 - 145 mmol/L   Potassium 3.3 (L) 3.5 - 5.1 mmol/L   Chloride 100 98 - 111 mmol/L   CO2 21 (L) 22 - 32 mmol/L   Glucose, Bld 250 (H) 70 - 99 mg/dL    Comment: Glucose reference range applies only to samples taken after fasting for at least 8 hours.   BUN 5 (L) 8 - 23 mg/dL   Creatinine, Ser 9.21 0.44 - 1.00 mg/dL   Calcium 8.3 (L) 8.9 - 10.3 mg/dL   Total Protein 6.0 (L) 6.5 - 8.1 g/dL   Albumin  2.7 (L) 3.5 - 5.0 g/dL   AST 80 (H) 15 - 41 U/L    ALT 25 0 - 44 U/L   Alkaline Phosphatase 110 38 - 126 U/L   Total Bilirubin 1.1 <1.2 mg/dL   GFR, Estimated >39 >39 mL/min    Comment: (NOTE) Calculated using the CKD-EPI Creatinine Equation (2021)    Anion gap 13 5 - 15    Comment: Performed at [facility], [address] [place], [place] [address]  Magnesium      Status: Abnormal   Collection Time: 02/26/23  8:58 AM  Result Value Ref Range   Magnesium  1.1 (L) 1.7 - 2.4 mg/dL    Comment: Performed at [facility], [address] [place], [place] [address]  CBC with Differential     Status: Abnormal   Collection Time: 03/24/23  8:16 AM  Result Value Ref Range   WBC 11.6 (H) 4.0 - 10.5 K/uL   RBC 2.53 (L) 3.87 - 5.11 MIL/uL   Hemoglobin 9.2 (L) 12.0 - 15.0 g/dL   HCT 73.8 (L) 63.9 - 53.9 %   MCV 103.2 (H) 80.0 - 100.0 fL   MCH 36.4 (H) 26.0 - 34.0 pg   MCHC 35.2 30.0 - 36.0 g/dL   RDW 84.1 (H) 88.4 - 84.4 %   Platelets 269 150 - 400 K/uL   nRBC 0.0 0.0 - 0.2 %   Neutrophils Relative % 80 %   Neutro Abs 9.4 (H) 1.7 - 7.7 K/uL   Lymphocytes Relative 7 %   Lymphs Abs 0.8 0.7 - 4.0 K/uL   Monocytes Relative 11 %   Monocytes Absolute 1.2 (H) 0.1 - 1.0 K/uL   Eosinophils Relative 0 %   Eosinophils Absolute 0.0 0.0 - 0.5 K/uL   Basophils Relative 1 %   Basophils Absolute 0.1 0.0 - 0.1 K/uL   Immature Granulocytes 1 %   Abs Immature Granulocytes  0.07 0.00 - 0.07 K/uL    Comment: Performed at [facility], [address] [place], [place] [address]  Comprehensive metabolic panel     Status: Abnormal   Collection Time: 03/24/23  8:16 AM  Result Value Ref Range   Sodium 126 (L) 135 - 145 mmol/L   Potassium 3.4 (L) 3.5 - 5.1 mmol/L   Chloride 96 (L) 98 - 111 mmol/L   CO2 14 (L) 22 - 32 mmol/L   Glucose, Bld 372 (H) 70 - 99 mg/dL    Comment: Glucose reference range applies only to samples taken after fasting for at least 8 hours.   BUN 15 8 - 23 mg/dL   Creatinine, Ser 8.11 (H) 0.44 - 1.00 mg/dL    Calcium 7.4 (L) 8.9 - 10.3 mg/dL   Total Protein 5.7 (L) 6.5 - 8.1 g/dL   Albumin  2.1 (L) 3.5 - 5.0 g/dL   AST 897 (H) 15 - 41 U/L   ALT 32 0 - 44 U/L   Alkaline Phosphatase 205 (H) 38 - 126 U/L   Total Bilirubin 3.0 (H) 0.0 - 1.2 mg/dL   GFR, Estimated 30 (L) >60 mL/min    Comment: (NOTE) Calculated using the CKD-EPI Creatinine Equation (2021)    Anion gap 16 (H) 5 - 15    Comment: Performed at [facility], [address] [place], [place] [address]  Lipase, blood     Status: None   Collection Time: 03/24/23  8:16 AM  Result Value Ref Range   Lipase 18 11 - 51 U/L    Comment: Performed at [facility], [address] [place], [place] [address]  Protime-INR     Status: Abnormal   Collection Time: 03/24/23  8:16 AM  Result Value Ref Range   Prothrombin Time 19.0 (H) 11.4 - 15.2 seconds   INR 1.6 (H) 0.8 - 1.2    Comment: (NOTE) INR goal varies based on device and disease states. Performed at [facility], [address] [place], [place] [address]   Brain natriuretic peptide     Status: None   Collection Time: 03/24/23  8:16 AM  Result Value Ref Range   B Natriuretic Peptide 86.2 0.0 - 100.0 pg/mL    Comment: Performed at [facility], [address] [place], [place] [address]  Troponin I (High Sensitivity)     Status: None   Collection Time: 03/24/23  8:16 AM  Result Value Ref Range   Troponin I (High Sensitivity) 12 <18 ng/L    Comment: (NOTE) Elevated high sensitivity troponin I (hsTnI) values and significant  changes across serial measurements may suggest ACS but many other  chronic and acute conditions are known to elevate hsTnI results.  Refer to the Links section for chest pain algorithms and additional  guidance. Performed at [facility], [address] [place], [place] [address]   Technologist smear review     Status: None   Collection Time: 03/24/23  8:16 AM  Result Value Ref Range   WBC  MORPHOLOGY MORPHOLOGY UNREMARKABLE    RBC MORPHOLOGY NO SCHISTOCYTES SEEN    Plt Morphology MORPHOLOGY UNREMARKABLE    Clinical Information persistent fever and white count     Comment: Performed at [facility], [address] [place], [place] [address]  Lactic acid, plasma     Status: Abnormal   Collection Time: 03/24/23 11:30 AM  Result Value Ref Range   Lactic Acid, Venous 6.2 (HH) 0.5 - 1.9 mmol/L    Comment: CRITICAL RESULT CALLED TO, READ BACK BY AND VERIFIED WITH NOAH A. RN AT 1224 ON 03/24/23 BY [email or web address] Performed at [facility], [address] [place], [place] [address]   Acetaminophen  level     Status: Abnormal   Collection Time: 03/24/23 11:31 AM  Result Value Ref Range   Acetaminophen  (Tylenol ), Serum <10 (L) 10 - 30 ug/mL    Comment: (NOTE) Therapeutic concentrations vary significantly. A range of 10-30 ug/mL  may be an effective concentration for many patients. However, some  are best treated at concentrations outside of this range. Acetaminophen  concentrations >150 ug/mL at 4 hours after ingestion  and >50 ug/mL at 12 hours after ingestion are often associated with  toxic reactions.  Performed at [facility], [address] [place], [place] [address]   I-Stat venous blood gas, ED     Status: Abnormal   Collection Time: 03/24/23 11:45 AM  Result Value Ref Range   pH, Ven 7.451 (H) 7.25 - 7.43   pCO2, Ven 20.4 (L) 44 - 60 mmHg   pO2, Ven 36 32 - 45 mmHg   Bicarbonate 14.3 (L) 20.0 - 28.0 mmol/L   TCO2 15 (L) 22 - 32 mmol/L   O2 Saturation 75 %   Acid-base deficit 8.0 (H) 0.0 - 2.0 mmol/L   Sodium 130 (L) 135 - 145 mmol/L   Potassium 3.4 (L) 3.5 - 5.1 mmol/L   Calcium, Ion 0.89 (LL) 1.15 - 1.40 mmol/L   HCT 31.0 (L) 36.0 - 46.0 %   Hemoglobin 10.5 (L) 12.0 - 15.0 g/dL   Patient temperature 02.1 F    Collection site IV start    Drawn by Nurse    Sample type VENOUS    Comment NOTIFIED PHYSICIAN   Comprehensive metabolic  panel     Status: Abnormal   Collection Time: 03/25/23 11:59 AM  Result Value Ref Range   Sodium 129 (L) 135 - 145 mmol/L   Potassium 3.4 (L) 3.5 - 5.1 mmol/L   Chloride 98 98 - 111 mmol/L   CO2 16 (L) 22 - 32 mmol/L   Glucose, Bld 370 (H) 70 - 99 mg/dL    Comment: Glucose reference range applies only to samples taken after fasting for at least 8 hours.   BUN 18 8 - 23 mg/dL   Creatinine, Ser 8.23 (H) 0.44 - 1.00 mg/dL   Calcium 7.5 (L) 8.9 - 10.3 mg/dL   Total Protein 6.2 (L) 6.5 - 8.1 g/dL   Albumin  2.2 (L) 3.5 - 5.0 g/dL   AST 96 (H) 15 - 41 U/L   ALT 33 0 - 44 U/L   Alkaline Phosphatase 228 (H) 38 - 126 U/L   Total Bilirubin 3.7 (H) 0.0 - 1.2 mg/dL   GFR, Estimated 32 (L) >60 mL/min    Comment: (NOTE) Calculated using the CKD-EPI Creatinine Equation (2021)    Anion gap 15 5 - 15    Comment: Performed at [facility], [address] [place], [place] [address]  CBC with Differential     Status: Abnormal   Collection Time: 03/25/23 11:59 AM  Result Value Ref Range   WBC 12.5 (H) 4.0 - 10.5 K/uL   RBC 2.57 (L) 3.87 - 5.11 MIL/uL   Hemoglobin 9.3 (L) 12.0 - 15.0 g/dL   HCT 72.5 (L) 63.9 - 53.9 %  MCV 106.6 (H) 80.0 - 100.0 fL   MCH 36.2 (H) 26.0 - 34.0 pg   MCHC 33.9 30.0 - 36.0 g/dL   RDW 83.9 (H) 88.4 - 84.4 %   Platelets 274 150 - 400 K/uL   nRBC 0.0 0.0 - 0.2 %   Neutrophils Relative % 85 %   Neutro Abs 10.7 (H) 1.7 - 7.7 K/uL   Lymphocytes Relative 8 %   Lymphs Abs 0.9 0.7 - 4.0 K/uL   Monocytes Relative 6 %   Monocytes Absolute 0.8 0.1 - 1.0 K/uL   Eosinophils Relative 0 %   Eosinophils Absolute 0.0 0.0 - 0.5 K/uL   Basophils Relative 0 %   Basophils Absolute 0.0 0.0 - 0.1 K/uL   Immature Granulocytes 1 %   Abs Immature Granulocytes 0.08 (H) 0.00 - 0.07 K/uL    Comment: Performed at [facility], [address], [place], [place] [address]  Protime-INR     Status: Abnormal   Collection Time: 03/25/23 11:59 AM  Result  Value Ref Range   Prothrombin Time 20.1 (H) 11.4 - 15.2 seconds   INR 1.7 (H) 0.8 - 1.2    Comment: (NOTE) INR goal varies based on device and disease states. Performed at [facility], [address], [place], [place] [address]   Blood gas, venous     Status: Abnormal   Collection Time: 03/25/23 11:59 AM  Result Value Ref Range   pH, Ven 7.33 7.25 - 7.43   pCO2, Ven 36 (L) 44 - 60 mmHg   pO2, Ven <31 (LL) 32 - 45 mmHg    Comment: CRITICAL RESULT CALLED TO, READ BACK BY AND VERIFIED WITH: Z. TEETERS, RN @1222  03/25/23 BY VIN    Bicarbonate 19.0 (L) 20.0 - 28.0 mmol/L   Acid-base deficit 6.2 (H) 0.0 - 2.0 mmol/L   O2 Saturation 24.3 %   Patient temperature 37.0     Comment: Performed at [facility], [address] [place], [place] [address]  Ethanol     Status: None   Collection Time: 03/25/23 11:59 AM  Result Value Ref Range   Alcohol, Ethyl (B) <10 <10 mg/dL    Comment: (NOTE) Lowest detectable limit for serum alcohol is 10 mg/dL.  For medical purposes only. Performed at [facility], [address], [place], [place] [address]   I-Stat Lactic Acid     Status: Abnormal   Collection Time: 03/25/23 12:12 PM  Result Value Ref Range   Lactic Acid, Venous 4.9 (HH) 0.5 - 1.9 mmol/L   Comment NOTIFIED PHYSICIAN   I-Stat Lactic Acid     Status: Abnormal   Collection Time: 03/25/23  2:31 PM  Result Value Ref Range   Lactic Acid, Venous 4.4 (HH) 0.5 - 1.9 mmol/L   Comment NOTIFIED PHYSICIAN   Ammonia     Status: Abnormal   Collection Time: 03/25/23  4:05 PM  Result Value Ref Range   Ammonia 42 (H) 9 - 35 umol/L    Comment: Performed at [facility], [address], [place], [place] [address]  Rapid urine drug screen (hospital performed)     Status: None   Collection Time: 03/25/23  6:35 PM  Result Value Ref Range   Opiates NONE DETECTED NONE DETECTED   Cocaine NONE DETECTED NONE DETECTED    Benzodiazepines NONE DETECTED NONE DETECTED   Amphetamines NONE DETECTED NONE DETECTED   Tetrahydrocannabinol NONE DETECTED NONE DETECTED   Barbiturates NONE  DETECTED NONE DETECTED    Comment: (NOTE) DRUG SCREEN FOR MEDICAL PURPOSES ONLY.  IF CONFIRMATION IS NEEDED FOR ANY PURPOSE, NOTIFY LAB WITHIN 5 DAYS.  LOWEST DETECTABLE LIMITS FOR URINE DRUG SCREEN Drug Class                     Cutoff (ng/mL) Amphetamine and metabolites    1000 Barbiturate and metabolites    200 Benzodiazepine                 200 Opiates and metabolites        300 Cocaine and metabolites        300 THC                            50 Performed at [facility], [address], [place], [place] [address]    EKG was independently reviewed and showed a sinus tachycardia with a QTc of 563  Assessment and Plan: No notes have been filed under this hospital service. Service: Hospitalist  Suspected Cirrhosis /Steatohepatitis with Hyperammonemia, Abnormal Lfts, and Hyperbilirubinemia and Ascites  Lactic Acidosis due to Liver Dysfunction -Has abdominal Distention and swelling and bilateral LE Swelling -AST,ALT,Bilirubin, and Ammonia Trend: Recent Labs  Lab 02/26/23 0858 03/24/23 0816 03/25/23 1159 03/25/23 1605  AST 80* 102* 96*  --   ALT 25 32 33  --   BILITOT 1.1 3.0* 3.7*  --   AMMONIA  --   --   --  42*  -Lactic Acid Level Trend: Recent Labs  Lab 03/24/23 1130 03/25/23 1212 03/25/23 1431  LATICACIDVEN 6.2* 4.9* 4.4*  -PT-INR was 20.1-1.7 -GI Consulted and will obtain a Paracentesis with Labs -Give a Dose of IV Lasix  20 mg x1 -2 Gram Sodum Diet -Will likely need po Furosemide  and Spironolactone when Renal Fxn Improves  Chronic Pancreatitis -C/w Creon  -CT Scan shoed sequale of Chronic Pancreatitis from longstanding Alcohol abuse  Leukocytosis -Likely 2/2 to Above -WBC Trend: Recent Labs  Lab 02/26/23 0858 03/24/23 0816 03/25/23 1159  WBC 6.9 11.6* 12.5*  -Check Blood  Cx x2 -DG Chest done and showed  no acute cardiopulmonary abnormality -Continue to Monitor for S/Sx of Infection and repeat CBC in the AM  LE Edema -LE Duplex Negative for VTE -Give Diuresis with Lasix  20 mg x1  ? Portal Vein Thrombosis -Ruled out by CTA but ? If needs MRI -RUQ U/S done and showed Chronic Hepatic steatosis with evidence now of Cirrhosis, PORTAL VEIN THROMBOSIS, and right upper quadrant ascites which is new since 2023. -CTA Abdomen and Pelvis done and showed There is diffuse heterogeneous attenuation of the liver with diminutive but patent portal and hepatic veins. Findings are concerning for steatohepatitis (masslike steatosis). Correlate clinically and with liver function tests. Findings favor sequela of chronic pancreatitis, as described above. Multiple other nonacute observations (such as simple cysts in bilateral kidneys, right hemicolon apparent wall thickening, moderate-to-large ascites, etc.), as described above.  Alcoholism with Concern for Withdrawal -Has 2-3 Drinks of EtOH a day and has been doing that for years -Place on CIWA protocol -MVI+Minerals, Folic Acid  and Thiamine   AKI Metabolic Acidosis  -Baseline Cr Normal with last normal Reading back in December 18th -BUN/Cr Trend: Recent Labs  Lab 02/26/23 0858 03/24/23 0816 03/25/23 1159  BUN 5* 15 18  CREATININE 0.78 1.88* 1.76*  -Patient has a metabolic acidosis with a CO2 of 16, anion gap of 15, chloride level of  98 -Avoid Nephrotoxic Medications, Contrast Dyes, Hypotension and Dehydration to Ensure Adequate Renal Perfusion and will need to Renally Adjust Meds -Continue to Monitor and Trend Renal Function carefully and repeat CMP in the AM   Hypokalemia -Patient's K+ Level Trend: Recent Labs  Lab 02/26/23 0858 03/24/23 0816 03/24/23 1145 03/25/23 1159  K 3.3* 3.4* 3.4* 3.4*  -Replete with po KCL 40 mEQ BID -Continue to Monitor and Replete as Necessary -Repeat CMP in the AM   Diabetes  Mellitus Type 2 -Check HbA1c in the AM -Hold Home Meds -Place on Sensitive Novolog  SSI AC/HS  Hypervolemic Hyponatremia -Na+ Trend: Recent Labs  Lab 02/26/23 0858 03/24/23 0816 03/24/23 1145 03/25/23 1159  NA 134* 126* 130* 129*  -Continue to Monitor and Trend and repeat CMP in the AM   Macrocytic Anemia -IN the setting of Alcoholism -Hgb/Hct Trend: Recent Labs  Lab 02/26/23 0858 03/24/23 0816 03/24/23 1145 03/25/23 1159  HGB 10.6* 9.2* 10.5* 9.3*  HCT 30.5* 26.1* 31.0* 27.4*  MCV 112.1* 103.2*  --  106.6*  -Check Anemia Panel in the AM -Continue to Monitor for S/Sx of Bleeding; No overt bleeding -Repeat CBC in the AM   GERD/GI Prophylaxis -C/w PPI with Pantoprazole  40 mg po Daily  Hypoalbuminemia -Patient's Albumin  Trend: Recent Labs  Lab 02/26/23 0858 03/24/23 0816 03/25/23 1159  ALBUMIN  2.7* 2.1* 2.2*  -Continue to Monitor and Trend and repeat CMP in the AM  Prolonged QTc -QTc on admission was 563.  Replete electrolytes and repeat in the morning and check magnesium  level as well -Avoid QT prolonging medications -Repeat EKG in a.m.  Advance Care Planning:   Code Status: Full Code   Consults: Gastroenterology  Family Communication: No family present at bedside  Severity of Illness: The appropriate patient status for this patient is INPATIENT. Inpatient status is judged to be reasonable and necessary in order to provide the required intensity of service to ensure the patient's safety. The patient's presenting symptoms, physical exam findings, and initial radiographic and laboratory data in the context of their chronic comorbidities is felt to place them at high risk for further clinical deterioration. Furthermore, it is not anticipated that the patient will be medically stable for discharge from the hospital within 2 midnights of admission.   * I certify that at the point of admission it is my clinical judgment that the patient will require inpatient  hospital care spanning beyond 2 midnights from the point of admission due to high intensity of service, high risk for further deterioration and high frequency of surveillance required.*  Author: Alejandro Marker, DO [facility] 03/25/2023 10:41 PM  For on call review [email or web address].

## 2023-03-25 NOTE — ED Notes (Signed)
 Patient connected to portable telemetry monitor and pulse ox.

## 2023-03-26 ENCOUNTER — Inpatient Hospital Stay (HOSPITAL_COMMUNITY): Payer: 59

## 2023-03-26 DIAGNOSIS — K7031 Alcoholic cirrhosis of liver with ascites: Secondary | ICD-10-CM | POA: Diagnosis not present

## 2023-03-26 LAB — URINALYSIS, ROUTINE W REFLEX MICROSCOPIC
Bilirubin Urine: NEGATIVE
Glucose, UA: NEGATIVE mg/dL
Hgb urine dipstick: NEGATIVE
Ketones, ur: NEGATIVE mg/dL
Nitrite: NEGATIVE
Protein, ur: NEGATIVE mg/dL
Specific Gravity, Urine: 1.045 — ABNORMAL HIGH (ref 1.005–1.030)
pH: 5 (ref 5.0–8.0)

## 2023-03-26 LAB — CBC
HCT: 24.3 % — ABNORMAL LOW (ref 36.0–46.0)
Hemoglobin: 8.4 g/dL — ABNORMAL LOW (ref 12.0–15.0)
MCH: 36.4 pg — ABNORMAL HIGH (ref 26.0–34.0)
MCHC: 34.6 g/dL (ref 30.0–36.0)
MCV: 105.2 fL — ABNORMAL HIGH (ref 80.0–100.0)
Platelets: 265 10*3/uL (ref 150–400)
RBC: 2.31 MIL/uL — ABNORMAL LOW (ref 3.87–5.11)
RDW: 16 % — ABNORMAL HIGH (ref 11.5–15.5)
WBC: 13.2 10*3/uL — ABNORMAL HIGH (ref 4.0–10.5)
nRBC: 0 % (ref 0.0–0.2)

## 2023-03-26 LAB — COMPREHENSIVE METABOLIC PANEL
ALT: 24 U/L (ref 0–44)
ALT: 27 U/L (ref 0–44)
AST: 64 U/L — ABNORMAL HIGH (ref 15–41)
AST: 81 U/L — ABNORMAL HIGH (ref 15–41)
Albumin: 2.5 g/dL — ABNORMAL LOW (ref 3.5–5.0)
Albumin: 2.6 g/dL — ABNORMAL LOW (ref 3.5–5.0)
Alkaline Phosphatase: 163 U/L — ABNORMAL HIGH (ref 38–126)
Alkaline Phosphatase: 182 U/L — ABNORMAL HIGH (ref 38–126)
Anion gap: 14 (ref 5–15)
Anion gap: 13 (ref 5–15)
BUN: 17 mg/dL (ref 8–23)
BUN: 17 mg/dL (ref 8–23)
CO2: 17 mmol/L — ABNORMAL LOW (ref 22–32)
CO2: 18 mmol/L — ABNORMAL LOW (ref 22–32)
Calcium: 7.3 mg/dL — ABNORMAL LOW (ref 8.9–10.3)
Calcium: 7.5 mg/dL — ABNORMAL LOW (ref 8.9–10.3)
Chloride: 102 mmol/L (ref 98–111)
Chloride: 98 mmol/L (ref 98–111)
Creatinine, Ser: 1.58 mg/dL — ABNORMAL HIGH (ref 0.44–1.00)
Creatinine, Ser: 1.79 mg/dL — ABNORMAL HIGH (ref 0.44–1.00)
GFR, Estimated: 37 mL/min — ABNORMAL LOW (ref >60)
GFR, Estimated: 31 mL/min — ABNORMAL LOW (ref >60)
Glucose, Bld: 166 mg/dL — ABNORMAL HIGH (ref 70–99)
Glucose, Bld: 227 mg/dL — ABNORMAL HIGH (ref 70–99)
Potassium: 2.8 mmol/L — ABNORMAL LOW (ref 3.5–5.1)
Potassium: 3.2 mmol/L — ABNORMAL LOW (ref 3.5–5.1)
Sodium: 133 mmol/L — ABNORMAL LOW (ref 135–145)
Sodium: 129 mmol/L — ABNORMAL LOW (ref 135–145)
Total Bilirubin: 3.2 mg/dL — ABNORMAL HIGH (ref 0.0–1.2)
Total Bilirubin: 3.2 mg/dL — ABNORMAL HIGH (ref 0.0–1.2)
Total Protein: 5.5 g/dL — ABNORMAL LOW (ref 6.5–8.1)
Total Protein: 5.9 g/dL — ABNORMAL LOW (ref 6.5–8.1)

## 2023-03-26 LAB — CBC WITH DIFFERENTIAL/PLATELET
Abs Immature Granulocytes: 0.06 10*3/uL (ref 0.00–0.07)
Basophils Absolute: 0.1 10*3/uL (ref 0.0–0.1)
Basophils Relative: 1 %
Eosinophils Absolute: 0.1 10*3/uL (ref 0.0–0.5)
Eosinophils Relative: 1 %
HCT: 23.8 % — ABNORMAL LOW (ref 36.0–46.0)
Hemoglobin: 8.4 g/dL — ABNORMAL LOW (ref 12.0–15.0)
Immature Granulocytes: 1 %
Lymphocytes Relative: 17 %
Lymphs Abs: 1.7 10*3/uL (ref 0.7–4.0)
MCH: 36.7 pg — ABNORMAL HIGH (ref 26.0–34.0)
MCHC: 35.3 g/dL (ref 30.0–36.0)
MCV: 103.9 fL — ABNORMAL HIGH (ref 80.0–100.0)
Monocytes Absolute: 1 10*3/uL (ref 0.1–1.0)
Monocytes Relative: 10 %
Neutro Abs: 7.3 10*3/uL (ref 1.7–7.7)
Neutrophils Relative %: 70 %
Platelets: 208 10*3/uL (ref 150–400)
RBC: 2.29 MIL/uL — ABNORMAL LOW (ref 3.87–5.11)
RDW: 16.1 % — ABNORMAL HIGH (ref 11.5–15.5)
WBC: 10.2 10*3/uL (ref 4.0–10.5)
nRBC: 0 % (ref 0.0–0.2)

## 2023-03-26 LAB — HEMOGLOBIN A1C
Hgb A1c MFr Bld: 8.1 % — ABNORMAL HIGH (ref 4.8–5.6)
Mean Plasma Glucose: 185.77 mg/dL

## 2023-03-26 LAB — ALBUMIN, PLEURAL OR PERITONEAL FLUID: Albumin, Fluid: <1.5 g/dL

## 2023-03-26 LAB — PROTEIN, PLEURAL OR PERITONEAL FLUID: Total protein, fluid: <3 g/dL

## 2023-03-26 LAB — GRAM STAIN: Gram Stain: NONE SEEN

## 2023-03-26 LAB — FERRITIN: Ferritin: 839 ng/mL — ABNORMAL HIGH (ref 11–307)

## 2023-03-26 LAB — SODIUM, URINE, RANDOM: Sodium, Ur: <10 mmol/L

## 2023-03-26 LAB — HIV ANTIBODY (ROUTINE TESTING W REFLEX): HIV Screen 4th Generation wRfx: NONREACTIVE

## 2023-03-26 LAB — MAGNESIUM: Magnesium: 1 mg/dL — ABNORMAL LOW (ref 1.7–2.4)

## 2023-03-26 LAB — CBG MONITORING, ED
Glucose-Capillary: 196 mg/dL — ABNORMAL HIGH (ref 70–99)
Glucose-Capillary: 243 mg/dL — ABNORMAL HIGH (ref 70–99)

## 2023-03-26 LAB — AMYLASE, PLEURAL OR PERITONEAL FLUID: Amylase, Fluid: <5 U/L

## 2023-03-26 LAB — GLUCOSE, CAPILLARY
Glucose-Capillary: 203 mg/dL — ABNORMAL HIGH (ref 70–99)
Glucose-Capillary: 178 mg/dL — ABNORMAL HIGH (ref 70–99)

## 2023-03-26 LAB — GLUCOSE, PLEURAL OR PERITONEAL FLUID: Glucose, Fluid: 229 mg/dL

## 2023-03-26 LAB — IRON AND TIBC: Iron: 79 ug/dL (ref 28–170)

## 2023-03-26 LAB — BODY FLUID CELL COUNT WITH DIFFERENTIAL
Eos, Fluid: 0 %
Lymphs, Fluid: 12 %
Monocyte-Macrophage-Serous Fluid: 81 % (ref 50–90)
Neutrophil Count, Fluid: 7 % (ref 0–25)
Total Nucleated Cell Count, Fluid: 45 uL (ref 0–1000)

## 2023-03-26 LAB — RETICULOCYTES
Immature Retic Fract: 15.6 % (ref 2.3–15.9)
RBC.: 2.3 MIL/uL — ABNORMAL LOW (ref 3.87–5.11)
Retic Count, Absolute: 49 10*3/uL (ref 19.0–186.0)
Retic Ct Pct: 2.1 % (ref 0.4–3.1)

## 2023-03-26 LAB — VITAMIN B12: Vitamin B-12: 738 pg/mL (ref 180–914)

## 2023-03-26 LAB — TSH: TSH: 11.451 u[IU]/mL — ABNORMAL HIGH (ref 0.350–4.500)

## 2023-03-26 LAB — PHOSPHORUS: Phosphorus: 2.8 mg/dL (ref 2.5–4.6)

## 2023-03-26 LAB — FOLATE: Folate: 16.6 ng/mL (ref >5.9)

## 2023-03-26 MED ORDER — FUROSEMIDE 10 MG/ML IJ SOLN
20.0000 mg | Freq: Once | INTRAMUSCULAR | Status: AC
  Administered 2023-03-26: 20 mg via INTRAVENOUS
  Filled 2023-03-26: qty 4

## 2023-03-26 MED ORDER — ALBUMIN HUMAN 25 % IV SOLN
25.0000 g | Freq: Four times a day (QID) | INTRAVENOUS | Status: AC
  Administered 2023-03-26 – 2023-03-27 (×2): 25 g via INTRAVENOUS
  Filled 2023-03-26 (×2): qty 100

## 2023-03-26 MED ORDER — ONDANSETRON HCL 4 MG PO TABS: 4.0000 mg | ORAL_TABLET | Freq: Four times a day (QID) | ORAL | Status: DC | PRN

## 2023-03-26 MED ORDER — ACETAMINOPHEN 325 MG PO TABS: 650.0000 mg | ORAL_TABLET | Freq: Four times a day (QID) | ORAL | Status: DC | PRN

## 2023-03-26 MED ORDER — ENOXAPARIN SODIUM 30 MG/0.3ML IJ SOSY
30.0000 mg | PREFILLED_SYRINGE | INTRAMUSCULAR | Status: DC
  Administered 2023-03-26: 30 mg via SUBCUTANEOUS
  Filled 2023-03-26: qty 0.3

## 2023-03-26 MED ORDER — BISACODYL 10 MG RE SUPP: 10.0000 mg | Freq: Every day | RECTAL | Status: DC | PRN

## 2023-03-26 MED ORDER — ACETAMINOPHEN 650 MG RE SUPP
650.0000 mg | Freq: Four times a day (QID) | RECTAL | Status: DC | PRN
Start: 2023-03-26 — End: 2023-03-27

## 2023-03-26 MED ORDER — HYDRALAZINE HCL 20 MG/ML IJ SOLN: 5.0000 mg | Freq: Four times a day (QID) | INTRAMUSCULAR | Status: DC | PRN

## 2023-03-26 MED ORDER — POLYETHYLENE GLYCOL 3350 17 G PO PACK: 17.0000 g | PACK | Freq: Every day | ORAL | Status: DC | PRN

## 2023-03-26 MED ORDER — DOCUSATE SODIUM 100 MG PO CAPS
100.0000 mg | ORAL_CAPSULE | Freq: Two times a day (BID) | ORAL | Status: DC
  Administered 2023-03-26: 100 mg via ORAL
  Filled 2023-03-26 (×2): qty 1

## 2023-03-26 MED ORDER — PANTOPRAZOLE SODIUM 40 MG PO TBEC
40.0000 mg | DELAYED_RELEASE_TABLET | Freq: Every day | ORAL | Status: DC
Start: 2023-03-26 — End: 2023-03-27
  Administered 2023-03-26 – 2023-03-27 (×2): 40 mg via ORAL
  Filled 2023-03-26 (×2): qty 1

## 2023-03-26 MED ORDER — FENTANYL CITRATE PF 50 MCG/ML IJ SOSY: 12.5000 ug | PREFILLED_SYRINGE | INTRAMUSCULAR | Status: DC | PRN

## 2023-03-26 MED ORDER — MAGNESIUM OXIDE -MG SUPPLEMENT 400 (240 MG) MG PO TABS
400.0000 mg | ORAL_TABLET | Freq: Every day | ORAL | Status: DC
  Administered 2023-03-26: 400 mg via ORAL
  Filled 2023-03-26 (×4): qty 1

## 2023-03-26 MED ORDER — MAGNESIUM SULFATE 4 GM/100ML IV SOLN
4.0000 g | Freq: Once | INTRAVENOUS | Status: AC
  Administered 2023-03-26: 4 g via INTRAVENOUS
  Filled 2023-03-26: qty 100

## 2023-03-26 MED ORDER — POTASSIUM CHLORIDE CRYS ER 20 MEQ PO TBCR
40.0000 meq | EXTENDED_RELEASE_TABLET | ORAL | Status: AC
  Administered 2023-03-26 (×2): 40 meq via ORAL
  Filled 2023-03-26 (×2): qty 2

## 2023-03-26 MED ORDER — ALBUMIN HUMAN 25 % IV SOLN
25.0000 g | Freq: Once | INTRAVENOUS | Status: AC
  Administered 2023-03-26: 25 g via INTRAVENOUS
  Filled 2023-03-26: qty 100

## 2023-03-26 MED ORDER — OXYCODONE HCL 5 MG PO TABS: 5.0000 mg | ORAL_TABLET | ORAL | Status: DC | PRN

## 2023-03-26 MED ORDER — LIDOCAINE HCL 1 % IJ SOLN
INTRAMUSCULAR | Status: AC
Start: 2023-03-26 — End: ?
  Filled 2023-03-26: qty 20

## 2023-03-26 MED ORDER — ONDANSETRON HCL 4 MG/2ML IJ SOLN: 4.0000 mg | Freq: Four times a day (QID) | INTRAMUSCULAR | Status: DC | PRN

## 2023-03-26 MED ORDER — PANCRELIPASE (LIP-PROT-AMYL) 12000-38000 UNITS PO CPEP
36000.0000 [IU] | ORAL_CAPSULE | Freq: Every day | ORAL | Status: DC
  Administered 2023-03-26 – 2023-03-27 (×2): 36000 [IU] via ORAL
  Filled 2023-03-26: qty 1
  Filled 2023-03-26: qty 3

## 2023-03-26 MED ORDER — LEVALBUTEROL HCL 0.63 MG/3ML IN NEBU: 0.6300 mg | INHALATION_SOLUTION | Freq: Four times a day (QID) | RESPIRATORY_TRACT | Status: DC | PRN

## 2023-03-26 MED ORDER — MIDODRINE HCL 5 MG PO TABS
5.0000 mg | ORAL_TABLET | Freq: Three times a day (TID) | ORAL | Status: DC
  Administered 2023-03-26 (×2): 5 mg via ORAL
  Filled 2023-03-26 (×2): qty 1

## 2023-03-26 NOTE — ED Notes (Signed)
 CBG [id number]

## 2023-03-26 NOTE — Anesthesia Preprocedure Evaluation (Addendum)
Anesthesia Evaluation  Patient identified by MRN, date of birth, ID band Patient awake    Reviewed: Allergy & Precautions, NPO status , Patient's Chart, lab work & pertinent test results  Airway Mallampati: II  TM Distance: >3 FB Neck ROM: Full    Dental no notable dental hx. (+) Teeth Intact, Dental Advisory Given   Pulmonary former smoker   Pulmonary exam normal breath sounds clear to auscultation       Cardiovascular + CAD  Normal cardiovascular exam Rhythm:Regular Rate:Normal     Neuro/Psych  negative psych ROS   GI/Hepatic ,GERD  ,,(+)     substance abuse  alcohol useHx of chronic pancreatitis   Endo/Other  diabetes, Type 2, Oral Hypoglycemic Agents    Renal/GU Renal disease     Musculoskeletal negative musculoskeletal ROS (+)    Abdominal   Peds  Hematology   Anesthesia Other Findings   Reproductive/Obstetrics negative OB ROS                             Anesthesia Physical Anesthesia Plan  ASA: 3  Anesthesia Plan: MAC   Post-op Pain Management: Minimal or no pain anticipated   Induction:   PONV Risk Score and Plan: 2 and Treatment may vary due to age or medical condition and Propofol infusion  Airway Management Planned: Natural Airway and Simple Face Mask  Additional Equipment: None  Intra-op Plan:   Post-operative Plan:   Informed Consent: I have reviewed the patients History and Physical, chart, labs and discussed the procedure including the risks, benefits and alternatives for the proposed anesthesia with the patient or authorized representative who has indicated his/her understanding and acceptance.     Dental advisory given  Plan Discussed with: Anesthesiologist and CRNA  Anesthesia Plan Comments: (EGD for Melena and Anemia)        Anesthesia Quick Evaluation

## 2023-03-26 NOTE — Procedures (Signed)
 PROCEDURE SUMMARY:  Successful US  guided paracentesis from the RLQ.  Yielded 2.2L of clear yellow fluid.  No immediate complications.  Pt tolerated well.   Specimen was sent for labs.  EBL < 5mL  Pearl Bents N Alhaji Mcneal PA-C 03/26/2023 11:58 AM

## 2023-03-26 NOTE — Progress Notes (Signed)
 PROGRESS NOTE  Chelsea Ross:[id number] DOB: 27-Apr-1960 DOA: 03/25/2023 PCP: Faustina Hood, MD   LOS: 1 day   Brief Narrative / Interim history: 63 year old female with history of DM2, chronic pancreatitis, daily EtOH intake for most of her life comes into the hospital with worsening abdominal distention and swelling, as well as swelling of the legs.  She has noticed some abdominal discomfort for the past several months, but the swelling is fairly new.  She presented to the ED the day prior to this admission, but left before being admitted, and came back to the ER the following day.  Workup initially showed right upper quadrant ultrasound which showed portal vein thrombosis as well as cirrhotic changes.  She has been now admitted to the hospital, and gastroenterology was consulted due to new onset diagnosis of cirrhosis and fluid overload  Subjective / 24h Interval events: She is doing well this morning, complains of abdominal distention, no nausea, no vomiting.  No diarrhea.  She does report melena a few days ago but not currently.  Assesement and Plan: Principal problem Underlying alcohol induced liver cirrhosis, abnormal LFTs -LFTs are gradual improvement today, AST is elevated.  ALT, bilirubin also elevated at 3.2.  She is decompensated with fluid overload -Obtain paracentesis, fluid studies ordered. -Will need intermittent diuretics, however she has soft blood pressures in the ED -MELD Na is 24.  Poor prognosis -Patient also reported melena few days ago  Active problems  Chronic Pancreatitis-continue Creon , imaging of the abdomen showed chronic pancreatitis from longstanding EtOH use   Leukocytosis -Likely 2/2 to Above   LE Edema -LE Duplex Negative for VTE.  Status post Lasix  x 1  Portal vein thrombosis, ruled out -initial right upper quadrant ultrasound done on 1/13 was concerning for portal vein thrombosis, however CT scan during this admission ruled it out.  Discussed with  gastroenterology as well as Dr. Maryalice Smaller with hematology, CT scan felt to be more accurate.  Alcoholism with Concern for Withdrawal -Has 2-3 Drinks of EtOH a day and has been doing that for years. Continue CIWA, no significant withdrawals this morning -I do not think she realizes the gravity of the situation.  She tells me she wants to quit alcohol but not fully committing, tells me that for now she will drink less.  Her fianc is also drinking and she does have alcohol in her home.  Counseled for cessation.  Continue MVI, folic acid , thiamine   AKI, Metabolic Acidosis  - Baseline Cr Normal with last normal Reading back in December 18th.  Suspect intravascular depletion in the setting of third spacing.  Status post albumin  as well as furosemide  -Given persistent hypotension, started midodrine  today -Obtain urinalysis as well as urine sodium, hepatorenal syndrome is among the differential  Hypokalemia, hypomagnesemia-in the setting of poor p.o. intake due to ongoing EtOH use.  Replace and continue to monitor  Diabetes Mellitus Type 2 -Check HbA1c in the AM, currently pending, continue sliding scale   Hyponatremia-likely in the setting of liver disease   Macrocytic Anemia -in the setting of EtOH use as well as liver disease.  Anemia panel pending  GERD/GI Prophylaxis -continue PPI.  Recent melena reported.  Discussed with GI  Scheduled Meds:  folic acid   1 mg Oral Daily   insulin  aspart  0-5 Units Subcutaneous QHS   insulin  aspart  0-9 Units Subcutaneous TID WC   multivitamin with minerals  1 tablet Oral Daily   potassium chloride   40 mEq Oral Q3H  thiamine   100 mg Oral Daily   Or   thiamine   100 mg Intravenous Daily   Continuous Infusions:  magnesium  sulfate bolus IVPB 4 g (03/26/23 0747)   PRN Meds:.LORazepam  **OR** LORazepam   Current Outpatient Medications  Medication Instructions   linagliptin (TRADJENTA) 5 MG TABS tablet 1 tablet, Oral, Daily   lipase/protease/amylase (CREON )  36,000 Units, Oral, Daily   losartan (COZAAR) 25 mg, Oral, Daily   magnesium  oxide (MAG-OX) 400 mg, Oral, Daily   metFORMIN (GLUCOPHAGE) 500 mg, Oral, Daily   pantoprazole  (PROTONIX ) 40 mg, Daily   potassium chloride  (KLOR-CON  M) 10 MEQ tablet 10 mEq, Oral, 2 times weekly    Diet Orders (From admission, onward)     Start     Ordered   03/25/23 1506  Diet 2 gram sodium Room service appropriate? Yes; Fluid consistency: Thin  Diet effective now       Question Answer Comment  Room service appropriate? Yes   Fluid consistency: Thin      03/25/23 1506            DVT prophylaxis:    Lab Results  Component Value Date   PLT 208 03/26/2023      Code Status: Full Code  Family Communication: No family at bedside  Status is: Inpatient Remains inpatient appropriate because: severity of illness  Level of care: Progressive  Consultants:  GI  Objective: Vitals:   03/26/23 0145 03/26/23 0200 03/26/23 0702 03/26/23 0730  BP:   90/62 (!) 97/54  Pulse: 92 97 100 95  Resp: 20 19 16    Temp:   97.6 F (36.4 C)   TempSrc:   Oral   SpO2: 99% 100% 99% 100%    Intake/Output Summary (Last 24 hours) at 03/26/2023 0932 Last data filed at 03/26/2023 0700 Gross per 24 hour  Intake 100 ml  Output --  Net 100 ml   Wt Readings from Last 3 Encounters:  03/24/23 55.8 kg  02/26/23 47 kg  02/20/21 51.1 kg    Examination:  Constitutional: NAD Eyes: + scleral icterus ENMT: Mucous membranes are moist.  Neck: normal, supple Respiratory: clear to auscultation bilaterally, no wheezing, no crackles. Normal respiratory effort. No accessory muscle use.  Cardiovascular: Regular rate and rhythm, no murmurs / rubs / gallops.  2+ pitting lower extremity edema Abdomen: Mild distention noted, no tenderness. Bowel sounds positive.  Musculoskeletal: no clubbing / cyanosis.    Data Reviewed: I have independently reviewed following labs and imaging studies   CBC Recent Labs  Lab  03/24/23 0816 03/24/23 1145 03/25/23 1159 03/26/23 0535  WBC 11.6*  --  12.5* 10.2  HGB 9.2* 10.5* 9.3* 8.4*  HCT 26.1* 31.0* 27.4* 23.8*  PLT 269  --  274 208  MCV 103.2*  --  106.6* 103.9*  MCH 36.4*  --  36.2* 36.7*  MCHC 35.2  --  33.9 35.3  RDW 15.8*  --  16.0* 16.1*  LYMPHSABS 0.8  --  0.9 1.7  MONOABS 1.2*  --  0.8 1.0  EOSABS 0.0  --  0.0 0.1  BASOSABS 0.1  --  0.0 0.1    Recent Labs  Lab 03/24/23 0816 03/24/23 1130 03/24/23 1145 03/25/23 1159 03/25/23 1212 03/25/23 1431 03/25/23 1605 03/26/23 0535  NA 126*  --  130* 129*  --   --   --  133*  K 3.4*  --  3.4* 3.4*  --   --   --  2.8*  CL 96*  --   --  98  --   --   --  102  CO2 14*  --   --  16*  --   --   --  17*  GLUCOSE 372*  --   --  370*  --   --   --  166*  BUN 15  --   --  18  --   --   --  17  CREATININE 1.88*  --   --  1.76*  --   --   --  1.58*  CALCIUM 7.4*  --   --  7.5*  --   --   --  7.3*  AST 102*  --   --  96*  --   --   --  64*  ALT 32  --   --  33  --   --   --  24  ALKPHOS 205*  --   --  228*  --   --   --  163*  BILITOT 3.0*  --   --  3.7*  --   --   --  3.2*  ALBUMIN  2.1*  --   --  2.2*  --   --   --  2.5*  MG  --   --   --   --   --   --   --  1.0*  LATICACIDVEN  --  6.2*  --   --  4.9* 4.4*  --   --   INR 1.6*  --   --  1.7*  --   --   --   --   AMMONIA  --   --   --   --   --   --  42*  --   BNP 86.2  --   --   --   --   --   --   --     ------------------------------------------------------------------------------------------------------------------ No results for input(s): "CHOL", "HDL", "LDLCALC", "TRIG", "CHOLHDL", "LDLDIRECT" in the last 72 hours.  No results found for: "HGBA1C" ------------------------------------------------------------------------------------------------------------------ No results for input(s): "TSH", "T4TOTAL", "T3FREE", "THYROIDAB" in the last 72 hours.  Invalid input(s): "FREET3"  Cardiac Enzymes No results for input(s): "CKMB", "TROPONINI",  "MYOGLOBIN" in the last 168 hours.  Invalid input(s): "CK" ------------------------------------------------------------------------------------------------------------------    Component Value Date/Time   BNP 86.2 03/24/2023 0816    CBG: Recent Labs  Lab 03/25/23 2338 03/26/23 0804  GLUCAP 278* 196*    Recent Results (from the past 240 hours)  Culture, blood (routine x 2)     Status: None (Preliminary result)   Collection Time: 03/25/23  4:05 PM   Specimen: BLOOD  Result Value Ref Range Status   Specimen Description   Final    BLOOD BLOOD RIGHT WRIST Performed at [facility], [address], [place], [place] [address]    Special Requests   Final    BOTTLES DRAWN AEROBIC AND ANAEROBIC Blood Culture results may not be optimal due to an inadequate volume of blood received in culture bottles Performed at [facility], [address], [place], [place] [address]    Culture   Final    NO GROWTH < 12 HOURS Performed at [facility], [address], [place], [place] [address]    Report Status PENDING  Incomplete     Radiology Studies: CT ABDOMEN PELVIS W CONTRAST Result Date: 03/25/2023 CLINICAL DATA:  Portal vein thrombosis ascites, portal vein thrombosis. EXAM: CT ABDOMEN AND PELVIS WITH CONTRAST  TECHNIQUE: Multidetector CT imaging of the abdomen and pelvis was performed using the standard protocol following bolus administration of intravenous contrast. RADIATION DOSE REDUCTION: This exam was performed according to the departmental dose-optimization program which includes automated exposure control, adjustment of the mA and/or kV according to patient size and/or use of iterative reconstruction technique. CONTRAST:  80mL OMNIPAQUE  IOHEXOL  300 MG/ML  SOLN COMPARISON:  MRI abdomen from 07/08/2016. FINDINGS: Lower chest: The lung bases are clear. No pleural effusion. The heart is normal in size. No pericardial effusion. Hepatobiliary: The liver  is normal in size. There is diffuse heterogeneous attenuation of the liver due to ill-defined infiltrating hypoattenuating areas. Liver surface is predominantly smooth however, there is subtle nodularity along the surface of left hepatic lobe, segment 3/4B. There is external mass effect on the hepatic veins and portal veins, which are diminutive in size however, known distortion of the veins. No intrahepatic or extrahepatic bile duct dilation. Physiologically distended gallbladder. No calcified gallstones. No abnormal wall thickening. No pericholecystic fat stranding. Pancreas: The pancreas is atrophic and exhibit dense dystrophic calcifications, asymmetrically involving the pancreatic head/uncinate process. There is mild dilation of main pancreatic duct mainly in the body region. There are several calcifications which are likely within the main pancreatic duct. Findings represent sequela of chronic pancreatitis. No surrounding fat stranding to suggest superimposed acute pancreatitis. No suspicious lesion seen. Spleen: Within normal limits. No focal lesion. Adrenals/Urinary Tract: Adrenal glands are unremarkable. There are several simple cysts in bilateral kidneys with largest in the right kidney lower pole, laterally measuring up to 0.9 x 1.3 cm. No nephroureterolithiasis or obstructive uropathy. Urinary bladder is under distended, precluding optimal assessment. However, no large mass or stones identified. No perivesical fat stranding. Stomach/Bowel: No disproportionate dilation of the small or large bowel loops. Unremarkable appendix. There is mild-to-moderate circumferential thickening of the ascending colon and hepatic flexure colon with mild pericolonic fat stranding/prominence of vasa recta, likely secondary to portal colopathy. However, differential diagnosis also includes right-sided colitis. Correlate clinically. Vascular/Lymphatic: There is moderate to large ascites. There is nodularity in the omentum,  likely related to ascites as well. No pneumoperitoneum. No abdominal or pelvic lymphadenopathy, by size criteria. No aneurysmal dilation of the major abdominal arteries. There are moderate peripheral atherosclerotic vascular calcifications of the aorta and its major branches. Reproductive: The uterus is unremarkable. No large adnexal mass. Other: There is a tiny fat containing umbilical hernia. There is mild anasarca. Musculoskeletal: No suspicious osseous lesions. There are mild multilevel degenerative changes in the visualized spine. IMPRESSION: 1. There is diffuse heterogeneous attenuation of the liver with diminutive but patent portal and hepatic veins. Findings are concerning for steatohepatitis (masslike steatosis). Correlate clinically and with liver function tests. 2. Findings favor sequela of chronic pancreatitis, as described above. 3. Multiple other nonacute observations (such as simple cysts in bilateral kidneys, right hemicolon apparent wall thickening, moderate-to-large ascites, etc.), as described above. Electronically Signed   By: Beula Brunswick M.D.   On: 03/25/2023 14:02   VAS US  LOWER EXTREMITY VENOUS (DVT) (ONLY [facility] & [facility]) Result Date: 03/25/2023  Lower Venous DVT Study Patient Name:  TERRY LITTREL  Date of Exam:   03/25/2023 Medical Rec #: [id number]     Accession #:    [id number] Date of Birth: 09/03/1960     Patient Gender: F Patient Age:   46 years Exam Location:  [facility] Procedure:      VAS US  LOWER EXTREMITY VENOUS (DVT) Referring Phys: Paris Bolds --------------------------------------------------------------------------------  Indications: Swelling, and Edema.  Risk Factors: None identified. Comparison Study: None. Performing Technologist: Estanislao Heimlich  Examination Guidelines: A complete evaluation includes B-mode imaging, spectral Doppler, color Doppler, and power Doppler as needed of all accessible portions of each vessel. Bilateral testing is considered an integral  part of a complete examination. Limited examinations for reoccurring indications may be performed as noted. The reflux portion of the exam is performed with the patient in reverse Trendelenburg.  +---------+---------------+---------+-----------+----------+--------------+ RIGHT    CompressibilityPhasicitySpontaneityPropertiesThrombus Aging +---------+---------------+---------+-----------+----------+--------------+ CFV      Full           Yes      Yes                                 +---------+---------------+---------+-----------+----------+--------------+ SFJ      Full                                                        +---------+---------------+---------+-----------+----------+--------------+ FV Prox  Full                                                        +---------+---------------+---------+-----------+----------+--------------+ FV Mid   Full                                                        +---------+---------------+---------+-----------+----------+--------------+ FV DistalFull                                                        +---------+---------------+---------+-----------+----------+--------------+ PFV      Full                                                        +---------+---------------+---------+-----------+----------+--------------+ POP      Full           Yes      Yes                                 +---------+---------------+---------+-----------+----------+--------------+ PTV      Full                                                        +---------+---------------+---------+-----------+----------+--------------+ PERO     Full                                                        +---------+---------------+---------+-----------+----------+--------------+   +---------+---------------+---------+-----------+----------+--------------+  LEFT     CompressibilityPhasicitySpontaneityPropertiesThrombus Aging  +---------+---------------+---------+-----------+----------+--------------+ CFV      Full           Yes      Yes                                 +---------+---------------+---------+-----------+----------+--------------+ SFJ      Full                                                        +---------+---------------+---------+-----------+----------+--------------+ FV Prox  Full                                                        +---------+---------------+---------+-----------+----------+--------------+ FV Mid   Full                                                        +---------+---------------+---------+-----------+----------+--------------+ FV DistalFull                                                        +---------+---------------+---------+-----------+----------+--------------+ PFV      Full                                                        +---------+---------------+---------+-----------+----------+--------------+ POP      Full           Yes      Yes                                 +---------+---------------+---------+-----------+----------+--------------+ PTV      Full                                                        +---------+---------------+---------+-----------+----------+--------------+ PERO     Full                                                        +---------+---------------+---------+-----------+----------+--------------+     Summary: BILATERAL: - No evidence of deep vein thrombosis seen in the lower extremities, bilaterally. -No evidence of popliteal cyst, bilaterally.   *See table(s) above for measurements and observations. Electronically signed by Delaney Fearing on 03/25/2023 at 1:29:11 PM.  Final      Kathlen Para, MD, PhD [facility]  Between 7 am - 7 pm I am available, please contact me via Amion (for emergencies) or Securechat (non urgent messages)  Between 7 pm - 7 am I am not available,  please contact night coverage MD/APP via Amion

## 2023-03-26 NOTE — Progress Notes (Signed)
 Allyn L Mckeough 10:22 AM  Subjective: Patient doing well without any new complaints and she has not had her paracentesis which we discussed including the risk benefits and methods and we also talked about an endoscopy but she did not like the idea of banding but she will think about it and now she tells me she is supposed to have an endoscopy and colonoscopy next week at [facility] but has not met with anybody and does not have the prep but we discussed how I thought proceeding with an endoscopy tomorrow should be done prior to discharge and then if no other issues could go tomorrow afternoon and we answered all of her questions and we discussed the severity of her disease and the need to quit drinking  Objective: Vital signs stable afebrile no acute distress abdomen is soft nontender some ascites not too bad creatinine slight decrease liver test slight decrease white count normal hemoglobin slight drop platelet count okay  Assessment: 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

## 2023-03-27 ENCOUNTER — Inpatient Hospital Stay (HOSPITAL_COMMUNITY): Payer: 59 | Admitting: Anesthesiology

## 2023-03-27 ENCOUNTER — Encounter (HOSPITAL_COMMUNITY)
Admission: EM | Discharge status: Against Medical Advice | Payer: Self-pay | Source: Home / Self Care | Attending: Internal Medicine

## 2023-03-27 ENCOUNTER — Encounter (HOSPITAL_COMMUNITY): Payer: Self-pay | Admitting: Internal Medicine

## 2023-03-27 DIAGNOSIS — K3189 Other diseases of stomach and duodenum: Secondary | ICD-10-CM | POA: Diagnosis not present

## 2023-03-27 DIAGNOSIS — E119 Type 2 diabetes mellitus without complications: Secondary | ICD-10-CM | POA: Diagnosis not present

## 2023-03-27 DIAGNOSIS — K766 Portal hypertension: Secondary | ICD-10-CM | POA: Diagnosis not present

## 2023-03-27 DIAGNOSIS — D500 Iron deficiency anemia secondary to blood loss (chronic): Secondary | ICD-10-CM

## 2023-03-27 DIAGNOSIS — K7031 Alcoholic cirrhosis of liver with ascites: Secondary | ICD-10-CM | POA: Diagnosis not present

## 2023-03-27 HISTORY — PX: ESOPHAGOGASTRODUODENOSCOPY (EGD) WITH PROPOFOL: SHX5813

## 2023-03-27 LAB — COMPREHENSIVE METABOLIC PANEL
ALT: 19 U/L (ref 0–44)
AST: 53 U/L — ABNORMAL HIGH (ref 15–41)
Albumin: 3.3 g/dL — ABNORMAL LOW (ref 3.5–5.0)
Alkaline Phosphatase: 134 U/L — ABNORMAL HIGH (ref 38–126)
Anion gap: 11 (ref 5–15)
BUN: 15 mg/dL (ref 8–23)
CO2: 17 mmol/L — ABNORMAL LOW (ref 22–32)
Calcium: 7.9 mg/dL — ABNORMAL LOW (ref 8.9–10.3)
Chloride: 103 mmol/L (ref 98–111)
Creatinine, Ser: 1.78 mg/dL — ABNORMAL HIGH (ref 0.44–1.00)
GFR, Estimated: 32 mL/min — ABNORMAL LOW (ref >60)
Glucose, Bld: 331 mg/dL — ABNORMAL HIGH (ref 70–99)
Potassium: 4.6 mmol/L (ref 3.5–5.1)
Sodium: 131 mmol/L — ABNORMAL LOW (ref 135–145)
Total Bilirubin: 3.3 mg/dL — ABNORMAL HIGH (ref 0.0–1.2)
Total Protein: 5.7 g/dL — ABNORMAL LOW (ref 6.5–8.1)

## 2023-03-27 LAB — CBC
HCT: 21.3 % — ABNORMAL LOW (ref 36.0–46.0)
Hemoglobin: 7.4 g/dL — ABNORMAL LOW (ref 12.0–15.0)
MCH: 36.5 pg — ABNORMAL HIGH (ref 26.0–34.0)
MCHC: 34.7 g/dL (ref 30.0–36.0)
MCV: 104.9 fL — ABNORMAL HIGH (ref 80.0–100.0)
Platelets: 187 10*3/uL (ref 150–400)
RBC: 2.03 MIL/uL — ABNORMAL LOW (ref 3.87–5.11)
RDW: 16.5 % — ABNORMAL HIGH (ref 11.5–15.5)
WBC: 9 10*3/uL (ref 4.0–10.5)
nRBC: 0 % (ref 0.0–0.2)

## 2023-03-27 LAB — GLUCOSE, CAPILLARY
Glucose-Capillary: 309 mg/dL — ABNORMAL HIGH (ref 70–99)
Glucose-Capillary: 318 mg/dL — ABNORMAL HIGH (ref 70–99)
Glucose-Capillary: 175 mg/dL — ABNORMAL HIGH (ref 70–99)
Glucose-Capillary: 127 mg/dL — ABNORMAL HIGH (ref 70–99)
Glucose-Capillary: 52 mg/dL — ABNORMAL LOW (ref 70–99)
Glucose-Capillary: 121 mg/dL — ABNORMAL HIGH (ref 70–99)

## 2023-03-27 LAB — MAGNESIUM: Magnesium: 2.1 mg/dL (ref 1.7–2.4)

## 2023-03-27 LAB — PHOSPHORUS: Phosphorus: 2 mg/dL — ABNORMAL LOW (ref 2.5–4.6)

## 2023-03-27 SURGERY — ESOPHAGOGASTRODUODENOSCOPY (EGD) WITH PROPOFOL
Anesthesia: Monitor Anesthesia Care

## 2023-03-27 MED ORDER — SODIUM CHLORIDE 0.9 % IV SOLN: INTRAVENOUS | Status: DC | PRN

## 2023-03-27 MED ORDER — MIDODRINE HCL 5 MG PO TABS
10.0000 mg | ORAL_TABLET | Freq: Three times a day (TID) | ORAL | Status: DC
  Administered 2023-03-27: 10 mg via ORAL
  Filled 2023-03-27: qty 2

## 2023-03-27 MED ORDER — ALBUMIN HUMAN 5 % IV SOLN
12.5000 g | Freq: Once | INTRAVENOUS | Status: AC
  Administered 2023-03-27: 12.5 g via INTRAVENOUS

## 2023-03-27 MED ORDER — ALBUMIN HUMAN 5 % IV SOLN
INTRAVENOUS | Status: AC
  Filled 2023-03-27: qty 250

## 2023-03-27 MED ORDER — ALBUMIN HUMAN 25 % IV SOLN
25.0000 g | Freq: Four times a day (QID) | INTRAVENOUS | Status: DC
  Administered 2023-03-27: 25 g via INTRAVENOUS
  Filled 2023-03-27: qty 100

## 2023-03-27 MED ORDER — PROPOFOL 500 MG/50ML IV EMUL
INTRAVENOUS | Status: DC | PRN
  Administered 2023-03-27: 125 ug/kg/min via INTRAVENOUS

## 2023-03-27 MED ORDER — DEXTROSE 50 % IV SOLN
12.5000 g | INTRAVENOUS | Status: AC
  Administered 2023-03-27: 12.5 g via INTRAVENOUS

## 2023-03-27 MED ORDER — PHENYLEPHRINE HCL-NACL 20-0.9 MG/250ML-% IV SOLN
INTRAVENOUS | Status: DC | PRN
  Administered 2023-03-27: 55 ug/min via INTRAVENOUS

## 2023-03-27 MED ORDER — MIDODRINE HCL 10 MG PO TABS: 5.0000 mg | ORAL_TABLET | Freq: Three times a day (TID) | ORAL | 0 refills | Status: AC

## 2023-03-27 MED ORDER — LIDOCAINE HCL (CARDIAC) PF 100 MG/5ML IV SOSY
PREFILLED_SYRINGE | INTRAVENOUS | Status: DC | PRN
  Administered 2023-03-27: 40 mg via INTRAVENOUS

## 2023-03-27 MED ORDER — DEXTROSE 50 % IV SOLN
INTRAVENOUS | Status: AC
  Filled 2023-03-27: qty 50

## 2023-03-27 SURGICAL SUPPLY — 14 items

## 2023-03-27 NOTE — Progress Notes (Signed)
Hypoglycemic Event  CBG: 52  Treatment: D50 25 mL (12.5 gm)  Symptoms: None  Follow-up CBG: Time:1407 CBG Result:127  Possible Reasons for Event: Inadequate meal intake - NPO for procedure  Comments/MD notified: MDA Houser notified. Verbal order for 12.5 g of D50. Medication given as ordered by Doristine Mango, RN (see MAR).    Eulas Post

## 2023-03-27 NOTE — Progress Notes (Signed)
Attempted to educate pt on ETOH withdrawal, safety concerns, and the current treatment plan. Pt stated multiple times, she is not going through withdrawal and will not. Pt stated her last alcoholic drink was Sunday. Pt's whole body is visually shaking when standing. When asked pt to hold out her arms, she attempts to hold them very stiff so no tremors will appear. Pt has denied all s/s of withdrawal. Pt  has stated she does not want anyone including the MD to tell her how bad the alcohol is and how she needs to quite drinking.

## 2023-03-27 NOTE — Op Note (Signed)
[facility] Patient Name: Chelsea Ross Procedure Date: 03/27/2023 MRN: [id number] Attending MD: Vida Rigger , MD, [id number] Date of Birth: 30-Mar-1960 CSN: [id number] Age: 63 Admit Type: Inpatient Procedure:                Upper GI endoscopy Indications:              Iron deficiency anemia secondary to chronic blood                            loss, Melena Providers:                Vida Rigger, MD, Fransisca Connors, Alan Ripper,                            Technician Referring MD:              Medicines:                Monitored Anesthesia Care Complications:            No immediate complications. Estimated Blood Loss:     Estimated blood loss: none. Procedure:                Pre-Anesthesia Assessment:                           - Prior to the procedure, a History and Physical                            was performed, and patient medications and                            allergies were reviewed. The patient's tolerance of                            previous anesthesia was also reviewed. The risks                            and benefits of the procedure and the sedation                            options and risks were discussed with the patient.                            All questions were answered, and informed consent                            was obtained. Prior Anticoagulants: The patient has                            taken no anticoagulant or antiplatelet agents. ASA                            Grade Assessment: II - A patient with mild systemic  disease. After reviewing the risks and benefits,                            the patient was deemed in satisfactory condition to                            undergo the procedure.                           After obtaining informed consent, the endoscope was                            passed under direct vision. Throughout the                            procedure, the patient's blood pressure, pulse,  and                            oxygen saturations were monitored continuously. The                            [id number]-[id number] ([id number]) Olympus endoscope was introduced                            through the mouth, and advanced to the fourth part                            of duodenum. The upper GI endoscopy was                            accomplished without difficulty. The patient                            tolerated the procedure well. Scope In: Scope Out: Findings:      The examined esophagus was normal.      Mild portal hypertensive gastropathy was found in the cardia, in the       gastric fundus and in the proximal gastric body.      The duodenal bulb, first portion of the duodenum, second portion of the       duodenum, third portion of the duodenum and fourth portion of the       duodenum were normal.      The exam was otherwise without abnormality. Impression:               - Normal esophagus.                           - Portal hypertensive gastropathy.                           - Normal duodenal bulb, first portion of the                            duodenum, second portion of the duodenum, third  portion of the duodenum and fourth portion of the                            duodenum.                           - The examination was otherwise normal.                           - No specimens collected. Moderate Sedation:      Not Applicable - Patient had care per Anesthesia. Recommendation:           - Soft diet today.                           - Continue present medications. Consider a trial of                            beta-blockers if blood pressure will allow will                            need a trial of diuretics like Aldactone 50 mg and                            Lasix 40 mg                           - Return to GI clinic PRN. Needs to stop drinking                            follow-up with your primary doctor in 1 to 2 weeks                             to recheck labs and adjust diuretics and can see                            either me in follow-up if needed or her primary                            care doctors groups own gastroenterologist                           - Telephone GI clinic if symptomatic PRN. Procedure Code(s):        --- Professional ---                           [phone], Esophagogastroduodenoscopy, flexible,                            transoral; diagnostic, including collection of                            specimen(s) by brushing or washing, when performed                            (  separate procedure) Diagnosis Code(s):        --- Professional ---                           K76.6, Portal hypertension                           K31.89, Other diseases of stomach and duodenum                           D50.0, Iron deficiency anemia secondary to blood                            loss (chronic)                           K92.1, Melena (includes Hematochezia) CPT copyright 2022 American Medical Association. All rights reserved. The codes documented in this report are preliminary and upon coder review may  be revised to meet current compliance requirements. Vida Rigger, MD 03/27/2023 3:00:08 PM This report has been signed electronically. Number of Addenda: 0

## 2023-03-27 NOTE — Progress Notes (Signed)
PROGRESS NOTE  Chelsea Ross ZOX:[id number] DOB: Dec 13, 1960 DOA: 03/25/2023 PCP: [name], MD   LOS: 2 days   Brief Narrative / Interim history: 63 year old female with history of DM2, chronic pancreatitis, daily EtOH intake for most of her life comes into the hospital with worsening abdominal distention and swelling, as well as swelling of the legs.  She has noticed some abdominal discomfort for the past several months, but the swelling is fairly new.  She presented to the ED the day prior to this admission, but left before being admitted, and came back to the ER the following day.  Workup initially showed right upper quadrant ultrasound which showed portal vein thrombosis as well as cirrhotic changes.  She has been now admitted to the hospital, and gastroenterology was consulted due to new onset diagnosis of cirrhosis and fluid overload  Subjective / 24h Interval events: She initially wanted to leave AMA this morning, but now has changed her mind after her fianc talked to her.  Assesement and Plan: Principal problem Underlying alcohol induced liver cirrhosis, abnormal LFTs -LFTs are gradual improvement today, AST is elevated.  ALT, bilirubin also elevated at 3.2.  She is decompensated with fluid overload -She underwent paracentesis 1/15, fluid status showing that this is likely related to her liver disease -Will eventually need diuretics, however hypotension and acute kidney injury precludes that from now -MELD Na is 24.  Poor prognosis -Patient also reported melena few days ago, GI consulted, plan for EGD later today  Active problems  Chronic Pancreatitis-continue Creon, imaging of the abdomen showed chronic pancreatitis from longstanding EtOH use   Leukocytosis -Likely 2/2 to Above   LE Edema -LE Duplex Negative for VTE.  Status post Lasix x 1 further diuretics due to hypotension and AKI  Portal vein thrombosis, ruled out -initial right upper quadrant ultrasound done on 1/13 was  concerning for portal vein thrombosis, however CT scan during this admission ruled it out.  Discussed with gastroenterology as well as Dr. Mosetta Putt with hematology, CT scan felt to be more accurate.  Alcoholism with Concern for Withdrawal -Has 2-3 Drinks of EtOH a day and has been doing that for years. Continue CIWA, no significant withdrawals this morning -I do not think she realizes the gravity of the situation.  She is not committing to abstinence, in fact she tells me that she is tired of everyone telling her that she needs to quit drinking  AKI, Metabolic Acidosis, concern for hepatorenal syndrome- Baseline Cr Normal with last normal Reading back in December 18th.  Suspect intravascular depletion in the setting of third spacing.  Status post albumin as well as furosemide -Increase midodrine dose due to persistent hypotension, repeat albumin -Urinalysis fairly bland, urine sodium less than 10, concerning for hepatorenal syndrome  Hypokalemia, hypomagnesemia-in the setting of poor p.o. intake due to ongoing EtOH use.  Continue to monitor and replete as indicated  Diabetes Mellitus Type 2, poorly controlled, with hyperglycemia-Check HbA1c in the AM, currently pending, continue sliding scale  Lab Results  Component Value Date   HGBA1C 8.1 (H) 03/26/2023   CBG (last 3)  Recent Labs    03/26/23 1708 03/26/23 2046 03/27/23 0736  GLUCAP 203* 178* 309*   Hyponatremia-likely in the setting of liver disease, overall stable when corrected for glucose   Macrocytic Anemia -in the setting of EtOH use as well as liver disease.    GERD/GI Prophylaxis -continue PPI.  Recent melena reported.  Discussed with GI  Scheduled Meds:  docusate sodium  100 mg Oral BID   enoxaparin (LOVENOX) injection  30 mg Subcutaneous Q24H   folic acid  1 mg Oral Daily   insulin aspart  0-5 Units Subcutaneous QHS   insulin aspart  0-9 Units Subcutaneous TID WC   lipase/protease/amylase  36,000 Units Oral Daily    magnesium oxide  400 mg Oral Daily   midodrine  10 mg Oral TID WC   multivitamin with minerals  1 tablet Oral Daily   pantoprazole  40 mg Oral Daily   thiamine  100 mg Oral Daily   Or   thiamine  100 mg Intravenous Daily   Continuous Infusions:  albumin human 25 g (03/27/23 1009)   PRN Meds:.acetaminophen **OR** acetaminophen, bisacodyl, fentaNYL (SUBLIMAZE) injection, hydrALAZINE, levalbuterol, LORazepam **OR** LORazepam, ondansetron **OR** ondansetron (ZOFRAN) IV, oxyCODONE, polyethylene glycol  Current Outpatient Medications  Medication Instructions   linagliptin (TRADJENTA) 5 MG TABS tablet 1 tablet, Oral, Daily   lipase/protease/amylase (CREON) 36,000 Units, Oral, Daily   losartan (COZAAR) 25 mg, Oral, Daily   magnesium oxide (MAG-OX) 400 mg, Oral, Daily   metFORMIN (GLUCOPHAGE) 500 mg, Oral, Daily   pantoprazole (PROTONIX) 40 mg, Daily   potassium chloride (KLOR-CON M) 10 MEQ tablet 10 mEq, Oral, 2 times weekly    Diet Orders (From admission, onward)     Start     Ordered   03/27/23 0001  Diet NPO time specified Except for: Sips with Meds  Diet effective midnight       Question:  Except for  Answer:  Sips with Meds   03/26/23 2039            DVT prophylaxis: SCDs Start: 03/26/23 1002 enoxaparin (LOVENOX) injection 30 mg Start: 03/26/23 1001   Lab Results  Component Value Date   PLT 187 03/27/2023      Code Status: Full Code  Family Communication: No family at bedside  Status is: Inpatient Remains inpatient appropriate because: severity of illness  Level of care: Progressive  Consultants:  GI  Objective: Vitals:   03/26/23 2344 03/27/23 0015 03/27/23 0337 03/27/23 1019  BP: 98/65  93/64 (!) 93/47  Pulse: 97  93 86  Resp:   16 16  Temp:   97.8 F (36.6 C) 97.8 F (36.6 C)  TempSrc:   Oral Oral  SpO2:   100% 100%  Weight:      Height:  5\' 5"  (1.651 m)      Intake/Output Summary (Last 24 hours) at 03/27/2023 1041 Last data filed at 03/26/2023  2327 Gross per 24 hour  Intake 150 ml  Output --  Net 150 ml   Wt Readings from Last 3 Encounters:  03/26/23 53.3 kg  03/24/23 55.8 kg  02/26/23 47 kg    Examination:  Constitutional: NAD Eyes: lids and conjunctivae normal, + scleral icterus ENMT: mmm Neck: normal, supple Respiratory: clear to auscultation bilaterally, no wheezing, no crackles. Normal respiratory effort.  Cardiovascular: Regular rate and rhythm, no murmurs / rubs / gallops.  2+ pitting edema Abdomen: soft, no distention, no tenderness. Bowel sounds positive.  Skin: no rashes  Data Reviewed: I have independently reviewed following labs and imaging studies   CBC Recent Labs  Lab 03/24/23 0816 03/24/23 1145 03/25/23 1159 03/26/23 0535 03/26/23 1107 03/27/23 0852  WBC 11.6*  --  12.5* 10.2 13.2* 9.0  HGB 9.2* 10.5* 9.3* 8.4* 8.4* 7.4*  HCT 26.1* 31.0* 27.4* 23.8* 24.3* 21.3*  PLT 269  --  274 208 265 187  MCV 103.2*  --  106.6* 103.9* 105.2* 104.9*  MCH 36.4*  --  36.2* 36.7* 36.4* 36.5*  MCHC 35.2  --  33.9 35.3 34.6 34.7  RDW 15.8*  --  16.0* 16.1* 16.0* 16.5*  LYMPHSABS 0.8  --  0.9 1.7  --   --   MONOABS 1.2*  --  0.8 1.0  --   --   EOSABS 0.0  --  0.0 0.1  --   --   BASOSABS 0.1  --  0.0 0.1  --   --     Recent Labs  Lab 03/24/23 0816 03/24/23 1130 03/24/23 1145 03/25/23 1159 03/25/23 1212 03/25/23 1431 03/25/23 1605 03/26/23 0535 03/26/23 1107 03/27/23 0852  NA 126*  --  130* 129*  --   --   --  133* 129* 131*  K 3.4*  --  3.4* 3.4*  --   --   --  2.8* 3.2* 4.6  CL 96*  --   --  98  --   --   --  102 98 103  CO2 14*  --   --  16*  --   --   --  17* 18* 17*  GLUCOSE 372*  --   --  370*  --   --   --  166* 227* 331*  BUN 15  --   --  18  --   --   --  17 17 15   CREATININE 1.88*  --   --  1.76*  --   --   --  1.58* 1.79* 1.78*  CALCIUM 7.4*  --   --  7.5*  --   --   --  7.3* 7.5* 7.9*  AST 102*  --   --  96*  --   --   --  64* 81* 53*  ALT 32  --   --  33  --   --   --  24 27 19    ALKPHOS 205*  --   --  228*  --   --   --  163* 182* 134*  BILITOT 3.0*  --   --  3.7*  --   --   --  3.2* 3.2* 3.3*  ALBUMIN 2.1*  --   --  2.2*  --   --   --  2.5* 2.6* 3.3*  MG  --   --   --   --   --   --   --  1.0*  --  2.1  LATICACIDVEN  --  6.2*  --   --  4.9* 4.4*  --   --   --   --   INR 1.6*  --   --  1.7*  --   --   --   --   --   --   TSH  --   --   --   --   --   --   --   --  11.451*  --   HGBA1C  --   --   --   --   --   --   --   --  8.1*  --   AMMONIA  --   --   --   --   --   --  42*  --   --   --   BNP 86.2  --   --   --   --   --   --   --   --   --     ------------------------------------------------------------------------------------------------------------------  No results for input(s): "CHOL", "HDL", "LDLCALC", "TRIG", "CHOLHDL", "LDLDIRECT" in the last 72 hours.  Lab Results  Component Value Date   HGBA1C 8.1 (H) 03/26/2023   ------------------------------------------------------------------------------------------------------------------ Recent Labs    03/26/23 1107  TSH 11.451*    Cardiac Enzymes No results for input(s): "CKMB", "TROPONINI", "MYOGLOBIN" in the last 168 hours.  Invalid input(s): "CK" ------------------------------------------------------------------------------------------------------------------    Component Value Date/Time   BNP 86.2 03/24/2023 0816    CBG: Recent Labs  Lab 03/26/23 0804 03/26/23 1243 03/26/23 1708 03/26/23 2046 03/27/23 0736  GLUCAP 196* 243* 203* 178* 309*    Recent Results (from the past 240 hours)  Culture, blood (routine x 2)     Status: None (Preliminary result)   Collection Time: 03/25/23  4:05 PM   Specimen: BLOOD  Result Value Ref Range Status   Specimen Description   Final    BLOOD BLOOD RIGHT WRIST Performed at [facility], [address] [place], [place] [address]    Special Requests   Final    BOTTLES DRAWN AEROBIC AND ANAEROBIC Blood Culture results may not be  optimal due to an inadequate volume of blood received in culture bottles Performed at [facility], [address] [place], [place] [address]    Culture   Final    NO GROWTH 2 DAYS Performed at [facility], [address], [place], [place] [address]    Report Status PENDING  Incomplete  Culture, body fluid w Gram Stain-bottle     Status: None (Preliminary result)   Collection Time: 03/26/23 12:25 PM   Specimen: Peritoneal Washings  Result Value Ref Range Status   Specimen Description PERITONEAL  Final   Special Requests PERITONEAL  Final   Culture   Final    NO GROWTH < 12 HOURS Performed at [facility], [address] [place], [place] [address]    Report Status PENDING  Incomplete  Gram stain     Status: None   Collection Time: 03/26/23 12:25 PM   Specimen: Peritoneal Washings  Result Value Ref Range Status   Specimen Description PERITONEAL  Final   Special Requests NONE  Final   Gram Stain   Final    NO WBC SEEN NO ORGANISMS SEEN Performed at [facility], [address], [place], [place] [address]    Report Status 03/26/2023 FINAL  Final     Radiology Studies: US Paracentesis Result Date: 03/26/2023 INDICATION: Patient is a 63 y/o female with history of type 2 diabetes mellitus, GERD, and ETOH abuse. Patient presents for a therapeutic and diagnostic paracentesis due to ascites. EXAM: ULTRASOUND GUIDED DIAGNOSTIC AND THERAPEUTIC PARACENTESIS MEDICATIONS: 10mL of lidocaine 1%. COMPLICATIONS: None immediate. PROCEDURE: Informed written consent was obtained from the patient after a discussion of the risks, benefits and alternatives to treatment. A timeout was performed prior to the initiation of the procedure. Initial ultrasound scanning demonstrates a large amount of ascites within the right lower abdominal quadrant. The right lower abdomen was prepped and draped in the usual sterile fashion. 1% lidocaine was used for local anesthesia.  Following this, a 19 gauge, 7-cm, Yueh catheter was introduced. An ultrasound image was saved for documentation purposes. The paracentesis was performed. The catheter was removed and a dressing was applied. The patient tolerated the procedure well without immediate post procedural complication. FINDINGS: A total of approximately 2.2L of clear, yellow fluid was removed. Samples were sent to the laboratory as requested by the clinical  team. IMPRESSION: Successful ultrasound-guided diagnostic and therapeutic paracentesis yielding 2.2 L of peritoneal fluid. Performed by Philipp Ovens PA-C PLAN: If the patient eventually requires >/=2 paracenteses in a 30 day period, candidacy for formal evaluation by the [facility] Hypert[facility] will be assessed. Roanna Banning, MD [facility] Electronically Signed   By: Roanna Banning M.D.   On: 03/26/2023 13:32     Pamella Pert, MD, PhD [facility]  Between 7 am - 7 pm I am available, please contact me via Amion (for emergencies) or Securechat (non urgent messages)  Between 7 pm - 7 am I am not available, please contact night coverage MD/APP via Amion

## 2023-03-27 NOTE — Transfer of Care (Signed)
Immediate Anesthesia Transfer of Care Note  Patient: Chelsea Ross  Procedure(s) Performed: Procedure(s): ESOPHAGOGASTRODUODENOSCOPY (EGD) WITH PROPOFOL (N/A)  Patient Location: Endoscopy Unit  Anesthesia Type:MAC  Level of Consciousness:  sedated, patient cooperative and responds to stimulation  Airway & Oxygen Therapy:Patient Spontanous Breathing and Patient connected to face mask oxgen  Post-op Assessment:  Report given to PACU RN and Post -op Vital signs reviewed and stable  Post vital signs:  Reviewed and stable  Last Vitals:  Vitals:   03/27/23 1322 03/27/23 1342  BP: (!) 84/52 (!) 85/45  Pulse: 92 85  Resp: 14 16  Temp: (!) 36.4 C 36.8 C  SpO2: 100% 96%    Complications: No apparent anesthesia complications

## 2023-03-27 NOTE — Inpatient Diabetes Management (Signed)
Inpatient Diabetes Program Recommendations  AACE/ADA: New Consensus Statement on Inpatient Glycemic Control (2015)  Target Ranges:  Prepandial:   less than 140 mg/dL      Peak postprandial:   less than 180 mg/dL (1-2 hours)      Critically ill patients:  140 - 180 mg/dL   Lab Results  Component Value Date   GLUCAP 127 (H) 03/27/2023   HGBA1C 8.1 (H) 03/26/2023    Review of Glycemic Control  Diabetes history: DM2 Outpatient Diabetes medications: tradjenta 5 daily, metformin 500 daily Current orders for Inpatient glycemic control: Novolog 0-9 TID and 0-5 HS (on Hold)  Hypo of 52 at 1348.  HgbA1C - 8.1%  Inpatient Diabetes Program Recommendations:   May need low dose basal insulin to start in am if CBG > 180 in the morning.  Noted hypo of 52 today. Likely from NPO status.  Will follow.  Thank you. Ailene Ards, RD, LDN, CDCES Inpatient Diabetes Coordinator [phone]

## 2023-03-27 NOTE — Progress Notes (Signed)
Patient removed her cardiac monitor box. Patient educated but she wants it off of her. Liana Crocker NP notified.

## 2023-03-27 NOTE — TOC Initial Note (Signed)
Transition of Care [facility]) - Initial/Assessment Note   Patient Details  Name: Chelsea Ross MRN: [id number] Date of Birth: 1960/07/04  [facility] CM/SW Contact:    Ewing Schlein, LCSW Phone Number: 03/27/2023, 12:42 PM  Clinical Narrative: [facility] consulted for ETOH use resources. CSW spoke with patient regarding consult. Patient declined resources at this time.             Expected Discharge Plan: Home/Self Care Barriers to Discharge: Continued Medical Work up  Patient Goals and CMS Choice Patient states their goals for this hospitalization and ongoing recovery are:: Return home Choice offered to / list presented to : NA  Expected Discharge Plan and Services In-house Referral: Clinical Social Work Discharge Planning Services: NA Post Acute Care Choice: NA Living arrangements for the past 2 months: Single Family Home           DME Arranged: N/A DME Agency: NA  Prior Living Arrangements/Services Living arrangements for the past 2 months: Single Family Home Lives with:: Significant Other Patient language and need for interpreter reviewed:: Yes Need for Family Participation in Patient Care: No (Comment) Care giver support system in place?: Yes (comment) Criminal Activity/Legal Involvement Pertinent to Current Situation/Hospitalization: No - Comment as needed  Activities of Daily Living ADL Screening (condition at time of admission) Independently performs ADLs?: Yes (appropriate for developmental age) Is the patient deaf or have difficulty hearing?: No Does the patient have difficulty seeing, even when wearing glasses/contacts?: No Does the patient have difficulty concentrating, remembering, or making decisions?: No  Emotional Assessment Attitude/Demeanor/Rapport: Engaged Affect (typically observed): Appropriate Orientation: : Oriented to Self, Oriented to Place, Oriented to  Time, Oriented to Situation Alcohol / Substance Use: Alcohol Use Psych Involvement: No  (comment)  Admission diagnosis:  Liver cirrhosis, alcoholic (HCC) [K70.30] Cirrhosis of liver with ascites, unspecified hepatic cirrhosis type (HCC) [K74.60, R18.8] Patient Active Problem List   Diagnosis Date Noted   Liver cirrhosis, alcoholic (HCC) 03/25/2023   Elevated coronary artery calcium score    Pulmonary nodules/lesions, multiple 01/10/2021   Dyspnea on exertion 01/10/2021   PCP:  [name], MD Pharmacy:   [id number] - [place], [place] - [address] [place] [address] Phone: [phone] Fax: [phone]  Social Drivers of Health (SDOH) Social History: SDOH Screenings   Food Insecurity: No Food Insecurity (03/26/2023)  Housing: Unknown (03/27/2023)  Transportation Needs: No Transportation Needs (03/26/2023)  Utilities: Not At Risk (03/26/2023)  Social Connections: Unknown (03/26/2023)  Tobacco Use: Medium Risk (03/27/2023)   SDOH Interventions:    Readmission Risk Interventions     No data to display

## 2023-03-27 NOTE — Progress Notes (Signed)
Chelsea Ross 2:21 PM  Subjective: Patient seen and examined and we rediscussed her procedure and she has no complaints doing better with her paracentesis no signs of GI bleeding  Objective: Blood pressure little low other vital signs stable afebrile no acute distress abdomen is soft nontender otherwise but exam please see preassessment evaluation hemoglobin slight drop white count and platelet count okay BUN and creatinine stable  Assessment: Cirrhosis and anemia and chronic renal insufficiency  Plan: Okay to proceed with endoscopy with anesthesia assistance and probably able to go home soon with needs for alcohol rehab or AA but needs to quit drinking and will need follow-up either with me or her [facility] primary team and their gastroenterologist  [facility]  office [phone] After 5PM or if no answer call [phone]

## 2023-03-27 NOTE — Progress Notes (Signed)
    Patient Name: Chelsea Ross           DOB: 1960/08/20  MRN: [id number]      Admission Date: 03/25/2023  Attending Provider: Leatha Gilding, MD  Primary Diagnosis: Liver cirrhosis, alcoholic (HCC)   Level of care: Progressive    CROSS COVER NOTE   Date of Service   03/27/2023   [name], 63 y.o. female, was admitted on 03/25/2023 for Liver cirrhosis, alcoholic (HCC).    HPI/Events of Note   Borderline low/soft BP-86/56 Asymptomatic.  A/O x 4.  All other vital stable.  Usual BP at home per patient ~SBP 90s. Patient had paracentesis completed today, 2.2 L removed.  Most recent serum albumin 2.6.  Addendum: BP back to patient's baseline, 98/65   Interventions/ Plan   Adding IV albumin        Anthoney Harada, DNP, ACNPC- [facility]

## 2023-03-27 NOTE — Progress Notes (Signed)
Mobility Specialist - Progress Note   03/27/23 0959  Mobility  Activity Ambulated independently in hallway  Level of Assistance Independent  Assistive Device None  Distance Ambulated (ft) 350 ft  Activity Response Tolerated well  Mobility Referral Yes  Mobility visit 1 Mobility  Mobility Specialist Start Time (ACUTE ONLY) 0951  Mobility Specialist Stop Time (ACUTE ONLY) 0958  Mobility Specialist Time Calculation (min) (ACUTE ONLY) 7 min   Pt received in bed and agreeable to mobility. No complaints during session. Pt to bathroom after session with all needs met.    [facility]

## 2023-03-27 NOTE — Anesthesia Postprocedure Evaluation (Signed)
Anesthesia Post Note  Patient: Chelsea Ross  Procedure(s) Performed: ESOPHAGOGASTRODUODENOSCOPY (EGD) WITH PROPOFOL     Patient location during evaluation: Endoscopy Anesthesia Type: MAC Level of consciousness: awake and alert Pain management: pain level controlled Vital Signs Assessment: post-procedure vital signs reviewed and stable Respiratory status: spontaneous breathing, nonlabored ventilation, respiratory function stable and patient connected to nasal cannula oxygen Cardiovascular status: blood pressure returned to baseline and stable Postop Assessment: no apparent nausea or vomiting Anesthetic complications: no   No notable events documented.  Last Vitals:  Vitals:   03/27/23 1510 03/27/23 1520  BP: (!) 89/45 (!) 90/52  Pulse: 91 95  Resp: 18 14  Temp:    SpO2: 94% 95%    Last Pain:  Vitals:   03/27/23 1520  TempSrc:   PainSc: 0-No pain                 Trevor Iha

## 2023-03-27 NOTE — Progress Notes (Signed)
Patient verbalized to this nurse that she wants to leave against medical advice in the morning stating that "she is just tired of being in the hospital. This nurse talked to her about doing the EGD and she said she's going to cancel it and just wait for her schedule next week. This nurse ask A. Virgel Manifold NP to talk to the patient but she is decided to leave in the morning.

## 2023-03-27 NOTE — Progress Notes (Signed)
Pt has continued to deny withdrawal s/s this morning. Pt has again requested that no one offers counseling nor tells her she needs to quite drinking. The pt specifically stated she was told by an MD(not named by the pt) that she must pour out and destroy all alcoholic beverages in her home. Pt stated that was unreasonable and she is not ready to consider this. Pt stated "we have parties all the time," "I am not ready for this," "I am not going to stop." Dr Elvera Lennox notified. Pt declined a phone call while RN was in room regarding ETOH cessation.   Dr Elvera Lennox and Shanda Bumps RN charge nurse notified of pt's refusal for Lovenox and telemetry.

## 2023-03-28 LAB — PH, BODY FLUID: pH, Body Fluid: 7.6

## 2023-03-28 LAB — CYTOLOGY - NON PAP

## 2023-03-28 NOTE — Discharge Summary (Signed)
Physician Chelsea Ross XBM:[id number] DOB: 08/16/1960 DOA: 03/25/2023  PCP: Chelsea Brunette, MD  Admit date: 03/25/2023 Discharge date: 03/28/2023  Admitted From: Home Disposition: Left Chelsea MEDICAL ADVICE  Recommendations for Outpatient Follow-up:  Follow up with GI as scheduled in 5 days Please obtain repeat BMP/CBC at the GI follow-up  Home Health: none Equipment/Devices: none  Discharge Condition: guarded CODE STATUS: Full code  Brief Narrative / Interim history: 63 year old female with history of DM2, chronic pancreatitis, daily EtOH intake for most of her life comes into the hospital with worsening abdominal distention and swelling, as well as swelling of the legs.  She has noticed some abdominal discomfort for the past several months, but the swelling is fairly new.  She presented to the ED the day prior to this admission, but left before being admitted, and came back to the ER the following day.  Workup initially showed right upper quadrant ultrasound which showed portal vein thrombosis as well as cirrhotic changes.  She has been now admitted to the hospital, and gastroenterology was consulted due to new onset diagnosis of cirrhosis and fluid overload  Hospital Course / Discharge diagnoses: Principal problem Underlying alcohol induced liver cirrhosis, abnormal LFTs -patient was admitted to the hospital with elevated LFTs in the setting of ongoing EtOH use.  Underwent paracentesis 1/15, fluid status showing that it is related to her underlying liver disease.  Will eventually need diuretics, however current hypotension and acute kidney injury represent barriers.  MELD now is 24.  Very poor prognosis.  Patient also reported melena, gastroenterology was consulted and showed mild portal hypertensive gastropathy in the cardia, gastric fundus as well as proximal gastric body.  Gastroenterology recommended a trial of beta-blockers if blood pressure  will allow as well as Aldactone, however due to hypotension this could not have been started at this point.  Unfortunately, despite extensive counseling, patient at this time expresses desire to leave the Hospital immidiately, patient has been warned that this is not Medically advisable at this time, and can result in Medical complications like Death and Disability, patient understands and accepts the risks involved and assumes full responsibilty of this decision.  Discussed about ongoing alcohol use resulting in worsening liver disease, worsening complications and death.  Expressed understanding however she is not ready to quit and wishes for me to stop advising alcohol cessation.   Active problems  Chronic Pancreatitis-continue Creon, imaging of the abdomen showed chronic pancreatitis from logstanding EtOH use Leukocytosis -Likely 2/2 to Above LE Edema -LE Duplex Negative for VTE.  Status post Lasix x 1, hold further diuretics due to hypotension and AKI.  Unfortunately she left AMA before stabilization of her blood pressure and acute kidney injury Portal vein thrombosis, ruled out -initial right upper quadrant ultrasound done on 1/13 was concerning for portal vein thrombosis, however CT scan during this admission ruled it out.  Discussed with gastroenterology, reading radiologist MD as well as Dr. Mosetta Putt with hematology, CT scan felt to be more accurate. Alcoholism with Concern for Withdrawal -Has 2-3 Drinks of EtOH a day and has been doing that for years. Continue CIWA, has tremors but subjectively denies withdrawals.  Unfortunately I do not think she realizes the gravity of the situation, and is not committed to abstinence  AKI, Metabolic Acidosis, concern for hepatorenal syndrome- Baseline Cr Normal with last normal Reading back in December 18th.  Suspect intravascular depletion in the setting of third spacing.  Status post albumin as well  as furosemide.  Patient was placed on midodrine to augment blood  pressure.  Urinalysis with fairly bland, urine sodium was less than 10, and this is highly concerning for hepatorenal syndrome.  This, additionally, confers a very poor prognosis Hypokalemia, hypomagnesemia-in the setting of poor p.o. intake due to ongoing EtOH use.  Continue to monitor and replete as indicated, unfortunately she left AMA Diabetes Mellitus Type 2, poorly controlled, with hyperglycemia-A1c 8.1.  She would benefit from better control  Sepsis ruled out   Discharge Instructions   Allergies as of 03/27/2023   Not on File      Medication List     STOP taking these medications    losartan 50 MG tablet Commonly known as: COZAAR       TAKE these medications    linagliptin 5 MG Tabs tablet Commonly known as: TRADJENTA Take 1 tablet by mouth daily.   lipase/protease/amylase 40102 UNITS Cpep capsule Commonly known as: CREON Take 36,000 Units by mouth daily.   magnesium oxide 400 MG tablet Commonly known as: MAG-OX Take 400 mg by mouth daily.   metFORMIN 500 MG tablet Commonly known as: GLUCOPHAGE Take 500 mg by mouth daily.   midodrine 10 MG tablet Commonly known as: PROAMATINE Take 0.5 tablets (5 mg total) by mouth 3 (three) times daily with meals.   pantoprazole 40 MG tablet Commonly known as: PROTONIX Take 40 mg by mouth daily.   potassium chloride 10 MEQ tablet Commonly known as: KLOR-CON M Take 10 mEq by mouth 2 (two) times a week.       Consultations: Gastroenterology  Procedures/Studies:  US Paracentesis Result Date: 03/26/2023 INDICATION: Patient is a 63 y/o female with history of type 2 diabetes mellitus, GERD, and ETOH abuse. Patient presents for a therapeutic and diagnostic paracentesis due to ascites. EXAM: ULTRASOUND GUIDED DIAGNOSTIC AND THERAPEUTIC PARACENTESIS MEDICATIONS: 10mL of lidocaine 1%. COMPLICATIONS: None immediate. PROCEDURE: Informed written consent was obtained from the patient after a discussion of the risks, benefits  and alternatives to treatment. A timeout was performed prior to the initiation of the procedure. Initial ultrasound scanning demonstrates a large amount of ascites within the right lower abdominal quadrant. The right lower abdomen was prepped and draped in the usual sterile fashion. 1% lidocaine was used for local anesthesia. Following this, a 19 gauge, 7-cm, Yueh catheter was introduced. An ultrasound image was saved for documentation purposes. The paracentesis was performed. The catheter was removed and a dressing was applied. The patient tolerated the procedure well without immediate post procedural complication. FINDINGS: A total of approximately 2.2L of clear, yellow fluid was removed. Samples were sent to the laboratory as requested by the clinical team. IMPRESSION: Successful ultrasound-guided diagnostic and therapeutic paracentesis yielding 2.2 L of peritoneal fluid. Performed by Philipp Ovens PA-C PLAN: If the patient eventually requires >/=2 paracenteses in a 30 day period, candidacy for formal evaluation by the [facility] will be assessed. Roanna Banning, MD [facility] Radiology Electronically Signed   By: Roanna Banning M.D.   On: 03/26/2023 13:32   CT ABDOMEN PELVIS W CONTRAST Result Date: 03/25/2023 CLINICAL DATA:  Portal vein thrombosis ascites, portal vein thrombosis. EXAM: CT ABDOMEN AND PELVIS WITH CONTRAST TECHNIQUE: Multidetector CT imaging of the abdomen and pelvis was performed using the standard protocol following bolus administration of intravenous contrast. RADIATION DOSE REDUCTION: This exam was performed according to the departmental dose-optimization program which includes automated exposure control, adjustment of the mA and/or kV  according to patient size and/or use of iterative reconstruction technique. CONTRAST:  80mL OMNIPAQUE IOHEXOL 300 MG/ML  SOLN COMPARISON:  MRI abdomen from  07/08/2016. FINDINGS: Lower chest: The lung bases are clear. No pleural effusion. The heart is normal in size. No pericardial effusion. Hepatobiliary: The liver is normal in size. There is diffuse heterogeneous attenuation of the liver due to ill-defined infiltrating hypoattenuating areas. Liver surface is predominantly smooth however, there is subtle nodularity along the surface of left hepatic lobe, segment 3/4B. There is external mass effect on the hepatic veins and portal veins, which are diminutive in size however, known distortion of the veins. No intrahepatic or extrahepatic bile duct dilation. Physiologically distended gallbladder. No calcified gallstones. No abnormal wall thickening. No pericholecystic fat stranding. Pancreas: The pancreas is atrophic and exhibit dense dystrophic calcifications, asymmetrically involving the pancreatic head/uncinate process. There is mild dilation of main pancreatic duct mainly in the body region. There are several calcifications which are likely within the main pancreatic duct. Findings represent sequela of chronic pancreatitis. No surrounding fat stranding to suggest superimposed acute pancreatitis. No suspicious lesion seen. Spleen: Within normal limits. No focal lesion. Adrenals/Urinary Tract: Adrenal glands are unremarkable. There are several simple cysts in bilateral kidneys with largest in the right kidney lower pole, laterally measuring up to 0.9 x 1.3 cm. No nephroureterolithiasis or obstructive uropathy. Urinary bladder is under distended, precluding optimal assessment. However, no large mass or stones identified. No perivesical fat stranding. Stomach/Bowel: No disproportionate dilation of the small or large bowel loops. Unremarkable appendix. There is mild-to-moderate circumferential thickening of the ascending colon and hepatic flexure colon with mild pericolonic fat stranding/prominence of vasa recta, likely secondary to portal colopathy. However, differential  diagnosis also includes right-sided colitis. Correlate clinically. Vascular/Lymphatic: There is moderate to large ascites. There is nodularity in the omentum, likely related to ascites as well. No pneumoperitoneum. No abdominal or pelvic lymphadenopathy, by size criteria. No aneurysmal dilation of the major abdominal arteries. There are moderate peripheral atherosclerotic vascular calcifications of the aorta and its major branches. Reproductive: The uterus is unremarkable. No large adnexal mass. Other: There is a tiny fat containing umbilical hernia. There is mild anasarca. Musculoskeletal: No suspicious osseous lesions. There are mild multilevel degenerative changes in the visualized spine. IMPRESSION: 1. There is diffuse heterogeneous attenuation of the liver with diminutive but patent portal and hepatic veins. Findings are concerning for steatohepatitis (masslike steatosis). Correlate clinically and with liver function tests. 2. Findings favor sequela of chronic pancreatitis, as described above. 3. Multiple other nonacute observations (such as simple cysts in bilateral kidneys, right hemicolon apparent wall thickening, moderate-to-large ascites, etc.), as described above. Electronically Signed   By: Jules Schick M.D.   On: 03/25/2023 14:02   VAS [place] LOWER EXTREMITY VENOUS (DVT) (ONLY [facility] & [facility]) Result Date: 03/25/2023  Lower Venous DVT Study Patient Name:  Chelsea Ross  Date of Exam:   03/25/2023 Medical Rec #: [id number]     Accession #:    [id number] Date of Birth: 24-Feb-1961     Patient Gender: F Patient Age:   66 years Exam Location:  [facility] Procedure:      VAS [place] LOWER EXTREMITY VENOUS (DVT) Referring Phys: Lynden Oxford --------------------------------------------------------------------------------  Indications: Swelling, and Edema.  Risk Factors: None identified. Comparison Study: None. Performing Technologist: Shona Simpson  Examination Guidelines: A complete evaluation includes B-mode  imaging, spectral Doppler, color Doppler, and power Doppler as needed of all accessible portions of each vessel. Bilateral testing is  considered an integral part of a complete examination. Limited examinations for reoccurring indications may be performed as noted. The reflux portion of the exam is performed with the patient in reverse Trendelenburg.  +---------+---------------+---------+-----------+----------+--------------+ RIGHT    CompressibilityPhasicitySpontaneityPropertiesThrombus Aging +---------+---------------+---------+-----------+----------+--------------+ CFV      Full           Yes      Yes                                 +---------+---------------+---------+-----------+----------+--------------+ SFJ      Full                                                        +---------+---------------+---------+-----------+----------+--------------+ FV Prox  Full                                                        +---------+---------------+---------+-----------+----------+--------------+ FV Mid   Full                                                        +---------+---------------+---------+-----------+----------+--------------+ FV DistalFull                                                        +---------+---------------+---------+-----------+----------+--------------+ PFV      Full                                                        +---------+---------------+---------+-----------+----------+--------------+ POP      Full           Yes      Yes                                 +---------+---------------+---------+-----------+----------+--------------+ PTV      Full                                                        +---------+---------------+---------+-----------+----------+--------------+ PERO     Full                                                        +---------+---------------+---------+-----------+----------+--------------+    +---------+---------------+---------+-----------+----------+--------------+ LEFT     CompressibilityPhasicitySpontaneityPropertiesThrombus Aging +---------+---------------+---------+-----------+----------+--------------+ CFV  Full           Yes      Yes                                 +---------+---------------+---------+-----------+----------+--------------+ SFJ      Full                                                        +---------+---------------+---------+-----------+----------+--------------+ FV Prox  Full                                                        +---------+---------------+---------+-----------+----------+--------------+ FV Mid   Full                                                        +---------+---------------+---------+-----------+----------+--------------+ FV DistalFull                                                        +---------+---------------+---------+-----------+----------+--------------+ PFV      Full                                                        +---------+---------------+---------+-----------+----------+--------------+ POP      Full           Yes      Yes                                 +---------+---------------+---------+-----------+----------+--------------+ PTV      Full                                                        +---------+---------------+---------+-----------+----------+--------------+ PERO     Full                                                        +---------+---------------+---------+-----------+----------+--------------+     Summary: BILATERAL: - No evidence of deep vein thrombosis seen in the lower extremities, bilaterally. -No evidence of popliteal cyst, bilaterally.   *See table(s) above for measurements and observations. Electronically signed by Carolynn Sayers on 03/25/2023 at 1:29:11 PM.    Final    US Abdomen Limited RUQ (LIVER/GB) Addendum Date:  03/24/2023 ADDENDUM REPORT:  03/24/2023 09:25 ADDENDUM: Study discussed by telephone with Dr. Virgina Norfolk on 03/24/2023 at 0920 hours. Electronically Signed   By: Odessa Fleming M.D.   On: 03/24/2023 09:25   Result Date: 03/24/2023 CLINICAL DATA:  63 year old female with abdominal swelling. EXAM: ULTRASOUND ABDOMEN LIMITED RIGHT UPPER QUADRANT COMPARISON:  Abdomen MRI 07/08/2016.  Chest CT 08/22/2021. FINDINGS: Gallbladder: Gallbladder wall thickening in the presence of ascites (image 7). No sludge or stones identified within the lumen. No sonographic Murphy sign elicited. Common bile duct: Diameter: 3 mm, normal. Liver: Chronic hepatic steatosis. Echogenic liver and coarse hepatic echotexture. Nodular liver contour (image 43). No discrete liver lesion by ultrasound. No main portal vein flow is detected with color Doppler (image 51). Other: Right upper quadrant ascites, fluid volume appears at least moderate (image 40). This is new since 2023. Nonobstructed visible right kidney. IMPRESSION: Chronic Hepatic steatosis with evidence now of Cirrhosis, PORTAL VEIN THROMBOSIS, and right upper quadrant ascites which is new since 2023. Electronically Signed: By: Odessa Fleming M.D. On: 03/24/2023 09:11   DG Chest Portable 1 View Result Date: 03/24/2023 CLINICAL DATA:  Leg swelling. EXAM: PORTABLE CHEST 1 VIEW COMPARISON:  None Available. FINDINGS: Bilateral lung fields are clear. No pulmonary embolism. Bilateral costophrenic angles are clear. Normal cardio-mediastinal silhouette. No acute osseous abnormalities. The soft tissues are within normal limits. IMPRESSION: *No acute cardiopulmonary abnormality. Electronically Signed   By: Jules Schick M.D.   On: 03/24/2023 08:39     The results of significant diagnostics from this hospitalization (including imaging, microbiology, ancillary and laboratory) are listed below for reference.     Microbiology: Recent Results (from the past 240 hours)  Culture, blood (routine x 2)      Status: None (Preliminary result)   Collection Time: 03/25/23  4:05 PM   Specimen: BLOOD  Result Value Ref Range Status   Specimen Description   Final    BLOOD BLOOD RIGHT WRIST Performed at [facility], [address], [place], [place] [address]    Special Requests   Final    BOTTLES DRAWN AEROBIC AND ANAEROBIC Blood Culture results may not be optimal due to an inadequate volume of blood received in culture bottles Performed at [facility], [address] [place], [place] [address]    Culture   Final    NO GROWTH 3 DAYS Performed at [facility], [address], [place], [place] [address]    Report Status PENDING  Incomplete  Culture, body fluid w Gram Stain-bottle     Status: None (Preliminary result)   Collection Time: 03/26/23 12:25 PM   Specimen: Peritoneal Washings  Result Value Ref Range Status   Specimen Description PERITONEAL  Final   Special Requests PERITONEAL  Final   Culture   Final    NO GROWTH 2 DAYS Performed at [facility], [address] [place], [place] [address]    Report Status PENDING  Incomplete  Gram stain     Status: None   Collection Time: 03/26/23 12:25 PM   Specimen: Peritoneal Washings  Result Value Ref Range Status   Specimen Description PERITONEAL  Final   Special Requests NONE  Final   Gram Stain   Final    NO WBC SEEN NO ORGANISMS SEEN Performed at [facility], [address], [place], [place] [address]    Report Status 03/26/2023 FINAL  Final     Labs: Basic Metabolic Panel: Recent Labs  Lab 03/24/23 [id number]  03/24/23 1145 03/25/23 1159 03/26/23 0535 03/26/23 1107 03/27/23 0852  NA 126* 130* 129* 133* 129* 131*  K 3.4* 3.4* 3.4* 2.8* 3.2* 4.6  CL 96*  --  98 102 98 103  CO2 14*  --  16* 17* 18* 17*  GLUCOSE 372*  --  370* 166* 227* 331*  BUN 15  --  18 17 17 15   CREATININE 1.88*  --  1.76* 1.58* 1.79* 1.78*  CALCIUM 7.4*  --  7.5* 7.3* 7.5* 7.9*  MG  --   --   --   1.0*  --  2.1  PHOS  --   --   --  2.8  --  2.0*   Liver Function Tests: Recent Labs  Lab 03/24/23 0816 03/25/23 1159 03/26/23 0535 03/26/23 1107 03/27/23 0852  AST 102* 96* 64* 81* 53*  ALT 32 33 24 27 19   ALKPHOS 205* 228* 163* 182* 134*  BILITOT 3.0* 3.7* 3.2* 3.2* 3.3*  PROT 5.7* 6.2* 5.5* 5.9* 5.7*  ALBUMIN 2.1* 2.2* 2.5* 2.6* 3.3*   CBC: Recent Labs  Lab 03/24/23 0816 03/24/23 1145 03/25/23 1159 03/26/23 0535 03/26/23 1107 03/27/23 0852  WBC 11.6*  --  12.5* 10.2 13.2* 9.0  NEUTROABS 9.4*  --  10.7* 7.3  --   --   HGB 9.2* 10.5* 9.3* 8.4* 8.4* 7.4*  HCT 26.1* 31.0* 27.4* 23.8* 24.3* 21.3*  MCV 103.2*  --  106.6* 103.9* 105.2* 104.9*  PLT 269  --  274 208 265 187   CBG: Recent Labs  Lab 03/27/23 1022 03/27/23 1200 03/27/23 1348 03/27/23 1406 03/27/23 1501  GLUCAP 318* 175* 52* 127* 121*   Hgb A1c Recent Labs    03/26/23 1107  HGBA1C 8.1*   Lipid Profile No results for input(s): "CHOL", "HDL", "LDLCALC", "TRIG", "CHOLHDL", "LDLDIRECT" in the last 72 hours. Thyroid function studies Recent Labs    03/26/23 1107  TSH 11.451*   Urinalysis    Component Value Date/Time   COLORURINE AMBER (A) 03/26/2023 0949   APPEARANCEUR HAZY (A) 03/26/2023 0949   LABSPEC 1.045 (H) 03/26/2023 0949   PHURINE 5.0 03/26/2023 0949   GLUCOSEU NEGATIVE 03/26/2023 0949   HGBUR NEGATIVE 03/26/2023 0949   BILIRUBINUR NEGATIVE 03/26/2023 0949   KETONESUR NEGATIVE 03/26/2023 0949   PROTEINUR NEGATIVE 03/26/2023 0949   NITRITE NEGATIVE 03/26/2023 0949   LEUKOCYTESUR SMALL (A) 03/26/2023 0949    FURTHER DISCHARGE INSTRUCTIONS:   Get Medicines reviewed and adjusted: Please take all your medications with you for your next visit with your Primary MD   Laboratory/radiological data: Please request your Primary MD to go over all hospital tests and procedure/radiological results at the follow up, please ask your Primary MD to get all Hospital records sent to his/her  office.   In some cases, they will be blood work, cultures and biopsy results pending at the time of your discharge. Please request that your primary care M.D. goes through all the records of your hospital data and follows up on these results.   Also Note the following: If you experience worsening of your admission symptoms, develop shortness of breath, life threatening emergency, suicidal or homicidal thoughts you must seek medical attention immediately by calling 911 or calling your MD immediately  if symptoms less severe.   You must read complete instructions/literature along with all the possible adverse reactions/side effects for all the Medicines you take and that have been prescribed to you. Take any new Medicines after you have completely understood and accpet all  the possible adverse reactions/side effects.    Do not drive when taking Pain medications or sleeping medications (Benzodaizepines)   Do not take more than prescribed Pain, Sleep and Anxiety Medications. It is not advisable to combine anxiety,sleep and pain medications without talking with your primary care practitioner   Special Instructions: If you have smoked or chewed Tobacco  in the last 2 yrs please stop smoking, stop any regular Alcohol  and or any Recreational drug use.   Wear Seat belts while driving.   Please note: You were cared for by a hospitalist during your hospital stay. Once you are discharged, your primary care physician will handle any further medical issues. Please note that NO REFILLS for any discharge medications will be authorized once you are discharged, as it is imperative that you return to your primary care physician (or establish a relationship with a primary care physician if you do not have one) for your post hospital discharge needs so that they can reassess your need for medications and monitor your lab values.  Time coordinating discharge: 15 minutes  SIGNED:  Pamella Pert, MD,  PhD 03/28/2023, 6:30 AM

## 2023-03-30 ENCOUNTER — Emergency Department (HOSPITAL_COMMUNITY): Payer: 59

## 2023-03-30 ENCOUNTER — Emergency Department (HOSPITAL_COMMUNITY)
Admission: EM | Admit: 2023-03-30 | Discharge: 2023-03-30 | Disposition: A | Discharge status: Home / Self Care | Payer: 59 | Attending: Emergency Medicine | Admitting: Emergency Medicine

## 2023-03-30 ENCOUNTER — Encounter (HOSPITAL_COMMUNITY): Payer: Self-pay | Admitting: *Deleted

## 2023-03-30 ENCOUNTER — Other Ambulatory Visit: Payer: Self-pay

## 2023-03-30 DIAGNOSIS — K7210 Chronic hepatic failure without coma: Secondary | ICD-10-CM | POA: Insufficient documentation

## 2023-03-30 DIAGNOSIS — K7031 Alcoholic cirrhosis of liver with ascites: Secondary | ICD-10-CM | POA: Diagnosis not present

## 2023-03-30 DIAGNOSIS — E1165 Type 2 diabetes mellitus with hyperglycemia: Secondary | ICD-10-CM | POA: Diagnosis not present

## 2023-03-30 DIAGNOSIS — Z7984 Long term (current) use of oral hypoglycemic drugs: Secondary | ICD-10-CM | POA: Insufficient documentation

## 2023-03-30 DIAGNOSIS — R739 Hyperglycemia, unspecified: Secondary | ICD-10-CM

## 2023-03-30 DIAGNOSIS — D649 Anemia, unspecified: Secondary | ICD-10-CM | POA: Insufficient documentation

## 2023-03-30 DIAGNOSIS — R600 Localized edema: Secondary | ICD-10-CM | POA: Diagnosis present

## 2023-03-30 LAB — COMPREHENSIVE METABOLIC PANEL
ALT: 21 U/L (ref 0–44)
AST: 51 U/L — ABNORMAL HIGH (ref 15–41)
Albumin: 3.1 g/dL — ABNORMAL LOW (ref 3.5–5.0)
Alkaline Phosphatase: 141 U/L — ABNORMAL HIGH (ref 38–126)
Anion gap: 9 (ref 5–15)
BUN: 10 mg/dL (ref 8–23)
CO2: 19 mmol/L — ABNORMAL LOW (ref 22–32)
Calcium: 8.7 mg/dL — ABNORMAL LOW (ref 8.9–10.3)
Chloride: 103 mmol/L (ref 98–111)
Creatinine, Ser: 1.36 mg/dL — ABNORMAL HIGH (ref 0.44–1.00)
GFR, Estimated: 44 mL/min — ABNORMAL LOW (ref >60)
Glucose, Bld: 368 mg/dL — ABNORMAL HIGH (ref 70–99)
Potassium: 4.3 mmol/L (ref 3.5–5.1)
Sodium: 131 mmol/L — ABNORMAL LOW (ref 135–145)
Total Bilirubin: 2.7 mg/dL — ABNORMAL HIGH (ref 0.0–1.2)
Total Protein: 5.9 g/dL — ABNORMAL LOW (ref 6.5–8.1)

## 2023-03-30 LAB — CULTURE, BLOOD (ROUTINE X 2): Culture: NO GROWTH

## 2023-03-30 LAB — CBC
HCT: 26.7 % — ABNORMAL LOW (ref 36.0–46.0)
Hemoglobin: 8.8 g/dL — ABNORMAL LOW (ref 12.0–15.0)
MCH: 35.5 pg — ABNORMAL HIGH (ref 26.0–34.0)
MCHC: 33 g/dL (ref 30.0–36.0)
MCV: 107.7 fL — ABNORMAL HIGH (ref 80.0–100.0)
Platelets: 204 10*3/uL (ref 150–400)
RBC: 2.48 MIL/uL — ABNORMAL LOW (ref 3.87–5.11)
RDW: 17.5 % — ABNORMAL HIGH (ref 11.5–15.5)
WBC: 11.8 10*3/uL — ABNORMAL HIGH (ref 4.0–10.5)
nRBC: 0 % (ref 0.0–0.2)

## 2023-03-30 LAB — PROTIME-INR
INR: 2.1 — ABNORMAL HIGH (ref 0.8–1.2)
Prothrombin Time: 23.4 s — ABNORMAL HIGH (ref 11.4–15.2)

## 2023-03-30 LAB — ETHANOL: Alcohol, Ethyl (B): <10 mg/dL (ref <10)

## 2023-03-30 NOTE — ED Triage Notes (Signed)
Here by POV from home for abd and BLE pedal swelling. H/o same. Paracentesis last week. Swelling has returned, and ios worse. Denies pain, jut uncomfortable. Denies fever, sob, NVD. Alert, NAD, calm, interactive, speech clear, steady gait.

## 2023-03-30 NOTE — Consult Note (Signed)
[facility] Initial Consultation Note  Chelsea Ross LFY:[id number] DOB: 1960-12-27 DOA: 03/30/2023  PCP: [name], MD   Requesting Physician: Dr. Rosalia Hammers  Reason for Consultation: Medical Management  HPI: Chelsea Ross is a 63 y.o. female with medical history significant for poorly controlled today diabetes, chronic pancreatitis, daily alcohol intake who was recently diagnosed with alcohol induced liver cirrhosis during recent hospital stay and left the hospital AMA on 1/16.  She underwent paracentesis of about 2 L of fluid on 1/15, MELD score 24 with very poor prognosis.  She was seen by gastroenterology Dr. Ewing Schlein, who performed upper endoscopy showing evidence of mild hypertensive gastropathy.  She had some acute kidney injury, and hypotension.  She left the hospital prior to these being stabilized, there was some discussion about benefit of starting her on diuretics but this could not be done due to her AKI and hypotension.  Today she really presented to the hospital for evaluation due to reaccumulation of her abdominal fluid.  She denies any significant abdominal pain, confusion, fevers, chills, nausea, vomiting, or diarrhea.  Denies dizziness or lightheadedness.  Review of Systems: Please see HPI for pertinent positives and negatives. A complete 10 system review of systems are otherwise negative.  Past Medical History:  Diagnosis Date   Diabetes mellitus without complication (HCC)    GERD (gastroesophageal reflux disease)    Renal disorder    Past Surgical History:  Procedure Laterality Date   MASS EXCISION Left 05/12/2017   Procedure: EXCISION MASS LEFT RING FINGER;  Surgeon: Betha Loa, MD;  Location: [facility];  Service: Orthopedics;  Laterality: Left;    Social History:  reports that she has quit smoking. Her smoking use included cigarettes. She has a 60 pack-year smoking history. She has never used smokeless tobacco. She reports current alcohol use. She  reports that she does not use drugs.  No Known Allergies  Family History  Problem Relation Age of Onset   Cancer Mother    Breast cancer Mother 44       mother had breast cancer 3 times   Hypertension Father    Diabetes Father      Prior to Admission medications   Medication Sig Start Date End Date Taking? Authorizing Provider  linagliptin (TRADJENTA) 5 MG TABS tablet Take 1 tablet by mouth daily. 07/04/20   [provider]  lipase/protease/amylase (CREON) 36000 UNITS CPEP capsule Take 36,000 Units by mouth daily.    [provider]  magnesium oxide (MAG-OX) 400 MG tablet Take 400 mg by mouth daily.    [provider]  metFORMIN (GLUCOPHAGE) 500 MG tablet Take 500 mg by mouth daily.    [provider]  midodrine (PROAMATINE) 10 MG tablet Take 0.5 tablets (5 mg total) by mouth 3 (three) times daily with meals. 03/27/23   Leatha Gilding, MD  pantoprazole (PROTONIX) 40 MG tablet Take 40 mg by mouth daily. Patient not taking: Reported on 03/25/2023    [provider]  potassium chloride (KLOR-CON M) 10 MEQ tablet Take 10 mEq by mouth 2 (two) times a week. 02/25/23   [provider]    Physical Exam: BP 103/68 (BP Location: Left Arm)   Pulse 95   Temp 98.3 F (36.8 C) (Oral)   Resp 18   Wt 53.1 kg   SpO2 99%   BMI 19.47 kg/m   General:  Alert, oriented, calm, in no acute distress, her husband is at the bedside.  She is  somewhat thin and malnourished appearing, looks chronically ill but nontoxic at the moment.  Pleasant and cooperative.  No evidence of tremor, confusion. Cardiovascular: RRR, no murmurs or rubs, no peripheral edema  Respiratory: clear to auscultation bilaterally, no wheezes, no crackles  Abdomen: soft, nontender, distended, normal bowel tones heard  Skin: dry, no rashes  Musculoskeletal: no joint effusions, normal range of motion  Psychiatric: appropriate affect, normal speech  Neurologic: extraocular muscles  intact, clear speech, moving all extremities with intact sensorium         Recent Labs and Imaging Reviewed:  Basic Metabolic Panel: Recent Labs  Lab 03/25/23 1159 03/26/23 0535 03/26/23 1107 03/27/23 0852 03/30/23 1304  NA 129* 133* 129* 131* 131*  K 3.4* 2.8* 3.2* 4.6 4.3  CL 98 102 98 103 103  CO2 16* 17* 18* 17* 19*  GLUCOSE 370* 166* 227* 331* 368*  BUN 18 17 17 15 10   CREATININE 1.76* 1.58* 1.79* 1.78* 1.36*  CALCIUM 7.5* 7.3* 7.5* 7.9* 8.7*  MG  --  1.0*  --  2.1  --   PHOS  --  2.8  --  2.0*  --    Liver Function Tests: Recent Labs  Lab 03/25/23 1159 03/26/23 0535 03/26/23 1107 03/27/23 0852 03/30/23 1304  AST 96* 64* 81* 53* 51*  ALT 33 24 27 19 21   ALKPHOS 228* 163* 182* 134* 141*  BILITOT 3.7* 3.2* 3.2* 3.3* 2.7*  PROT 6.2* 5.5* 5.9* 5.7* 5.9*  ALBUMIN 2.2* 2.5* 2.6* 3.3* 3.1*   Recent Labs  Lab 03/24/23 0816  LIPASE 18   Recent Labs  Lab 03/25/23 1605  AMMONIA 42*   CBC: Recent Labs  Lab 03/24/23 0816 03/24/23 1145 03/25/23 1159 03/26/23 0535 03/26/23 1107 03/27/23 0852 03/30/23 1304  WBC 11.6*  --  12.5* 10.2 13.2* 9.0 11.8*  NEUTROABS 9.4*  --  10.7* 7.3  --   --   --   HGB 9.2*   < > 9.3* 8.4* 8.4* 7.4* 8.8*  HCT 26.1*   < > 27.4* 23.8* 24.3* 21.3* 26.7*  MCV 103.2*  --  106.6* 103.9* 105.2* 104.9* 107.7*  PLT 269  --  274 208 265 187 204   < > = values in this interval not displayed.   Cardiac Enzymes: No results for input(s): "CKTOTAL", "CKMB", "CKMBINDEX", "TROPONINI" in the last 168 hours.  BNP (last 3 results) Recent Labs    03/24/23 0816  BNP 86.2    ProBNP (last 3 results) No results for input(s): "PROBNP" in the last 8760 hours.  CBG: Recent Labs  Lab 03/27/23 1022 03/27/23 1200 03/27/23 1348 03/27/23 1406 03/27/23 1501  GLUCAP 318* 175* 52* 127* 121*    Radiological Exams on Admission: DG Chest Port 1 View Result Date: 03/30/2023 CLINICAL DATA:  Shortness of breath.  Lower extremity swelling. EXAM:  PORTABLE CHEST 1 VIEW COMPARISON:  03/24/2023 FINDINGS: Heart size is normal. Aortic atherosclerotic calcification is seen. The lungs are clear. No edema or effusion. Patient has not taken a deep inspiration. No abnormal bone finding. IMPRESSION: No active disease. Aortic atherosclerotic calcification. Poor inspiration. No gross finding of congestive heart failure. Electronically Signed   By: Paulina Fusi M.D.   On: 03/30/2023 12:16    Summary and Recommendations: This is an unfortunate 63 year old female with a history of poorly controlled DM2, chronic pancreatitis, daily alcohol abuse which has resulted in alcohol induced liver cirrhosis.  Her anemia and acute kidney injury are improved, I agree with Dr. Rosalia Hammers that she  has no indication for acute hospitalization.  Recommend outpatient therapeutic paracentesis to manage her ascites. Would not initiate diuretics at the moment, due to continued hepatorenal syndrome, and hypotension. She will benefit from close outpatient GI management, she was instructed to call the [facility] GI office when they reopen on Tuesday to schedule outpatient follow-up.  She does have their number.  All questions were answered to her satisfaction, and she is agreeable to outpatient follow-up and management.  Thank you for involving [place] in the care of your patient.    Time spent: 55 minutes  Giovanni Bath Sharlette Dense MD [facility] Pager [phone]  If 7PM-7AM, please contact night-coverage [email or web address] Password [email or web address]  03/30/2023, 2:00 PM

## 2023-03-30 NOTE — Discharge Instructions (Signed)
Please call interventional radiology in the morning to arrange to have your paracentesis. Continue to drink plenty of fluids.  Call Dr. Marlane Hatcher office tomorrow for outpatient follow-up Your home medications and follow-up with your primary care doctors. Return to the ED if you are having new or worsening symptoms Please continue your metformin and check your blood sugar at home.

## 2023-03-30 NOTE — ED Provider Notes (Signed)
[place] EMERGENCY DEPARTMENT AT [facility] Provider Note   CSN: [id number] Arrival date & time: 03/30/23  1006     History  Chief Complaint  Patient presents with   Edema    TABOR RODINE is a 63 y.o. female.  HPI     61 female left hospital AGAINST MEDICAL ADVICE 2 days ago.  She has a history of type 2 diabetes, chronic pancreatitis, daily alcohol use, and has newly been found to be in liver failure.  She underwent paracentesis January 15.  Her MELD is 24.  Plans were to place patient on diuretics, however she had low blood pressure at that time.  She is also noted to have GI bleeding.  GI consulted showed portal hypertensive gastropathy and plan was to put her on beta-blockers, but again was not initiated due to low blood pressure. Patient's CBC showed anemia. She has lower extremity edema likely secondary to her renal failure but duplex was obtained and negative for VTE.  He also was noted to have acute kidney injury. She initially thought to have portal vein thrombosis but eventually was ruled out by CT scan. Patient returns complaining of abdominal distention and generalized weakness. Home Medications Prior to Admission medications   Medication Sig Start Date End Date Taking? Authorizing Provider  linagliptin (TRADJENTA) 5 MG TABS tablet Take 1 tablet by mouth daily. 07/04/20   [provider]  lipase/protease/amylase (CREON) 36000 UNITS CPEP capsule Take 36,000 Units by mouth daily.    [provider]  magnesium oxide (MAG-OX) 400 MG tablet Take 400 mg by mouth daily.    [provider]  metFORMIN (GLUCOPHAGE) 500 MG tablet Take 500 mg by mouth daily.    [provider]  midodrine (PROAMATINE) 10 MG tablet Take 0.5 tablets (5 mg total) by mouth 3 (three) times daily with meals. 03/27/23   Leatha Gilding, MD  pantoprazole (PROTONIX) 40 MG tablet Take 40 mg by mouth daily. Patient not taking: Reported on 03/25/2023    [provider]  potassium chloride (KLOR-CON M) 10 MEQ tablet Take 10 mEq by mouth 2 (two) times a week. 02/25/23   [provider]      Allergies    Patient has no known allergies.    Review of Systems   Review of Systems  Physical Exam Updated Vital Signs BP 103/68 (BP Location: Left Arm)   Pulse 95   Temp 98.3 F (36.8 C) (Oral)   Resp 18   Wt 53.1 kg   SpO2 99%   BMI 19.47 kg/m  Physical Exam Vitals reviewed.  Constitutional:      General: She is not in acute distress.    Appearance: She is ill-appearing.     Comments: Patient is cachectic  HENT:     Head: Normocephalic.     Right Ear: External ear normal.     Left Ear: External ear normal.     Nose: Nose normal.     Mouth/Throat:     Pharynx: Oropharynx is clear.  Eyes:     Comments: Conjunctivae are pale  Cardiovascular:     Rate and Rhythm: Normal rate.     Pulses: Normal pulses.  Pulmonary:     Effort: Pulmonary effort is normal.     Breath sounds: Normal breath sounds.  Abdominal:     General: Abdomen is flat. Bowel sounds are normal.     Palpations: Abdomen is soft.     Comments: Abdomen is diffusely  distended with  Musculoskeletal:        General: Normal range of motion.     Cervical back: Normal range of motion.  Skin:    General: Skin is dry.     Capillary Refill: Capillary refill takes less than 2 seconds.     Coloration: Skin is pale.  Neurological:     General: No focal deficit present.     Mental Status: She is alert.  Psychiatric:        Mood and Affect: Mood normal.        Behavior: Behavior normal.     ED Results / Procedures / Treatments   Labs (all labs ordered are listed, but only abnormal results are displayed) Labs Reviewed  CBC - Abnormal; Notable for the following components:      Result Value   WBC 11.8 (*)    RBC 2.48 (*)    Hemoglobin 8.8 (*)    HCT 26.7 (*)    MCV 107.7 (*)    MCH 35.5 (*)    RDW 17.5 (*)    All other components within normal limits   COMPREHENSIVE METABOLIC PANEL - Abnormal; Notable for the following components:   Sodium 131 (*)    CO2 19 (*)    Glucose, Bld 368 (*)    Creatinine, Ser 1.36 (*)    Calcium 8.7 (*)    Total Protein 5.9 (*)    Albumin 3.1 (*)    AST 51 (*)    Alkaline Phosphatase 141 (*)    Total Bilirubin 2.7 (*)    GFR, Estimated 44 (*)    All other components within normal limits  PROTIME-INR - Abnormal; Notable for the following components:   Prothrombin Time 23.4 (*)    INR 2.1 (*)    All other components within normal limits  ETHANOL    EKG None  Radiology DG Chest Port 1 View Result Date: 03/30/2023 CLINICAL DATA:  Shortness of breath.  Lower extremity swelling. EXAM: PORTABLE CHEST 1 VIEW COMPARISON:  03/24/2023 FINDINGS: Heart size is normal. Aortic atherosclerotic calcification is seen. The lungs are clear. No edema or effusion. Patient has not taken a deep inspiration. No abnormal bone finding. IMPRESSION: No active disease. Aortic atherosclerotic calcification. Poor inspiration. No gross finding of congestive heart failure. Electronically Signed   By: Paulina Fusi M.D.   On: 03/30/2023 12:16    Procedures Procedures    Medications Ordered in ED Medications - No data to display  ED Course/ Medical Decision Making/ A&P                                 Medical Decision Making Amount and/or Complexity of Data Reviewed Labs: ordered. Radiology: ordered.        Final Clinical Impression(s) / ED Diagnoses Final diagnoses:  Chronic liver failure without hepatic coma (HCC)  Ascites due to alcoholic cirrhosis (HCC)  Anemia, unspecified type  Hyperglycemia    Rx / DC Orders ED Discharge Orders     None         Margarita Grizzle, MD 03/30/23 1411

## 2023-03-31 LAB — CULTURE, BODY FLUID W GRAM STAIN -BOTTLE: Culture: NO GROWTH

## 2023-04-01 ENCOUNTER — Telehealth (HOSPITAL_COMMUNITY): Payer: Self-pay | Admitting: Radiology

## 2023-04-01 LAB — LIPASE, FLUID: Lipase-Fluid: <3 U/L

## 2023-04-01 LAB — TOTAL BILIRUBIN, BODY FLUID: Total bilirubin, fluid: 0.2 mg/dL

## 2023-04-01 NOTE — Telephone Encounter (Signed)
Returned call to pt. Pt called and left VM to schedule a paracentesis. I called her back and she stated that she has it taken care of and that she is going to [facility] for these procedures. JM

## 2023-04-25 LAB — FUNGUS CULTURE WITH STAIN

## 2023-04-25 LAB — FUNGAL ORGANISM REFLEX

## 2023-04-25 LAB — FUNGUS CULTURE RESULT

## 2023-04-30 ENCOUNTER — Encounter: Payer: Self-pay | Admitting: Emergency Medicine

## 2023-07-04 ENCOUNTER — Other Ambulatory Visit: Payer: Self-pay | Admitting: Family Medicine

## 2023-07-04 DIAGNOSIS — Z0000 Encounter for general adult medical examination without abnormal findings: Secondary | ICD-10-CM

## 2023-07-11 ENCOUNTER — Ambulatory Visit: Admitting: Podiatry

## 2023-07-15 ENCOUNTER — Ambulatory Visit: Admitting: Podiatry

## 2023-07-24 ENCOUNTER — Encounter: Payer: Self-pay | Admitting: Podiatry

## 2023-07-24 ENCOUNTER — Ambulatory Visit (INDEPENDENT_AMBULATORY_CARE_PROVIDER_SITE_OTHER)

## 2023-07-24 ENCOUNTER — Ambulatory Visit: Admitting: Podiatry

## 2023-07-24 VITALS — Ht 65.0 in | Wt 117.0 lb

## 2023-07-24 DIAGNOSIS — E1149 Type 2 diabetes mellitus with other diabetic neurological complication: Secondary | ICD-10-CM

## 2023-07-24 DIAGNOSIS — M779 Enthesopathy, unspecified: Secondary | ICD-10-CM

## 2023-07-24 DIAGNOSIS — M76821 Posterior tibial tendinitis, right leg: Secondary | ICD-10-CM | POA: Diagnosis not present

## 2023-07-24 DIAGNOSIS — E1140 Type 2 diabetes mellitus with diabetic neuropathy, unspecified: Secondary | ICD-10-CM

## 2023-07-24 MED ORDER — TRIAMCINOLONE ACETONIDE 10 MG/ML IJ SUSP
10.0000 mg | Freq: Once | INTRAMUSCULAR | Status: AC
Start: 2023-07-24 — End: 2023-07-24
  Administered 2023-07-24: 10 mg via INTRA_ARTICULAR

## 2023-07-28 NOTE — Progress Notes (Signed)
 Subjective:   Patient ID: Chelsea Ross, female   DOB: 63 y.o.   MRN: [id number]   HPI Patient presents stating she has developed a lot of pain in her right ankle over the last month and has been having numbness and discomfort in her feet for several months.  She does have diabetes doing the best she can to control patients did smoke does not smoke anymore and is not active.  States the right ankle has been swelling quite a bit for the last month   Review of Systems  All other systems reviewed and are negative.       Objective:  Physical Exam Vitals and nursing note reviewed.  Constitutional:      Appearance: She is well-developed.  Pulmonary:     Effort: Pulmonary effort is normal.  Musculoskeletal:        General: Normal range of motion.  Skin:    General: Skin is warm.  Neurological:     Mental Status: She is alert.     Neurovascular status intact muscle strength found to be adequate range of motion adequate with patient found to have significant discomfort and swelling in the right medial ankle posterior tibial tendon as it comes under the medial malleolus.  Patient has a lot of pain in this area and also does not have dysfunction of the tendon currently when tested.  Patient has good digital perfusion well-oriented moderate numbness of both feet which is probably due to longstanding diabetes and cirrhosis of the liver from an alcoholic perspective     Assessment:  Appears to be acute posterior tibial tendinitis right with inability to bear significant plantar weight right with no current indication of tear     Plan:  H&P reviewed all conditions discussed the importance of good control of sugar and I discussed the possibility there could be an interstitial tear but organ to hold off on treating that and treat conservatively.  I did explain injection and risk and she wants to have this done I did sterile prep I injected that she of the posterior tibial tendon in the area of  maximal tenderness 3 mg dexamethasone Kenalog  5 mg Xylocaine  I applied boot to immobilize the right lower leg and explained wearing this at all times when walking.  May require blood work may require MRI depending on response and will be seen back 3 weeks  X-rays indicate there is moderate depression of the arch no indications of arthritis or stress fracture fracture

## 2023-07-30 ENCOUNTER — Ambulatory Visit
Admission: RE | Admit: 2023-07-30 | Discharge: 2023-07-30 | Disposition: A | Discharge status: Home / Self Care | Source: Ambulatory Visit | Attending: Family Medicine | Admitting: Family Medicine

## 2023-07-30 DIAGNOSIS — Z0000 Encounter for general adult medical examination without abnormal findings: Secondary | ICD-10-CM

## 2023-08-14 ENCOUNTER — Ambulatory Visit (INDEPENDENT_AMBULATORY_CARE_PROVIDER_SITE_OTHER): Admitting: Podiatry

## 2023-08-14 ENCOUNTER — Encounter: Payer: Self-pay | Admitting: Podiatry

## 2023-08-14 VITALS — Ht 65.0 in | Wt 117.0 lb

## 2023-08-14 DIAGNOSIS — E1149 Type 2 diabetes mellitus with other diabetic neurological complication: Secondary | ICD-10-CM

## 2023-08-14 DIAGNOSIS — M76821 Posterior tibial tendinitis, right leg: Secondary | ICD-10-CM | POA: Diagnosis not present

## 2023-08-14 DIAGNOSIS — E1140 Type 2 diabetes mellitus with diabetic neuropathy, unspecified: Secondary | ICD-10-CM

## 2023-08-14 NOTE — Progress Notes (Addendum)
 Subjective:   Patient ID: Chelsea Ross, female   DOB: 63 y.o.   MRN: [id number]   HPI she is doing much better with her right ankle stating that it is improving and that she is able to walk with a better heel-toe gait pattern currently.  Diabetes under relatively good control does have history of neuropathic change   ROS      Objective:  Physical Exam Neurovascular status was found to be intact muscle strength is adequate inflammation pain of the medial side right ankle improved good motion no indication of muscle strength loss currently    Assessment:  Doing much better with posterior tibial tendinitis right with patient found to have good strength good structure noted     Plan:  Reviewed at great length.  I do think boot will be necessary for periods of time and I discussed support therapy proper shoe gear ice therapy and exercises.  Patient is discharged will be seen back on an as needed basis with all questions answered today

## 2023-08-26 ENCOUNTER — Other Ambulatory Visit (HOSPITAL_BASED_OUTPATIENT_CLINIC_OR_DEPARTMENT_OTHER)

## 2023-08-27 ENCOUNTER — Emergency Department (HOSPITAL_BASED_OUTPATIENT_CLINIC_OR_DEPARTMENT_OTHER)
Admission: EM | Admit: 2023-08-27 | Discharge: 2023-08-27 | Disposition: A | Discharge status: Home / Self Care | Attending: Emergency Medicine | Admitting: Emergency Medicine

## 2023-08-27 ENCOUNTER — Other Ambulatory Visit: Payer: Self-pay

## 2023-08-27 ENCOUNTER — Encounter (HOSPITAL_BASED_OUTPATIENT_CLINIC_OR_DEPARTMENT_OTHER): Payer: Self-pay

## 2023-08-27 DIAGNOSIS — I10 Essential (primary) hypertension: Secondary | ICD-10-CM | POA: Diagnosis not present

## 2023-08-27 DIAGNOSIS — Z85828 Personal history of other malignant neoplasm of skin: Secondary | ICD-10-CM | POA: Diagnosis not present

## 2023-08-27 DIAGNOSIS — R739 Hyperglycemia, unspecified: Secondary | ICD-10-CM | POA: Diagnosis present

## 2023-08-27 DIAGNOSIS — Z7984 Long term (current) use of oral hypoglycemic drugs: Secondary | ICD-10-CM | POA: Insufficient documentation

## 2023-08-27 DIAGNOSIS — E1165 Type 2 diabetes mellitus with hyperglycemia: Secondary | ICD-10-CM | POA: Diagnosis not present

## 2023-08-27 HISTORY — DX: Unspecified cirrhosis of liver: K74.60

## 2023-08-27 LAB — I-STAT VENOUS BLOOD GAS, ED
Acid-Base Excess: 0 mmol/L (ref 0.0–2.0)
Bicarbonate: 24.4 mmol/L (ref 20.0–28.0)
Calcium, Ion: 1.11 mmol/L — ABNORMAL LOW (ref 1.15–1.40)
HCT: 37 % (ref 36.0–46.0)
Hemoglobin: 12.6 g/dL (ref 12.0–15.0)
O2 Saturation: 51 %
Patient temperature: 97.6
Potassium: 6.2 mmol/L — ABNORMAL HIGH (ref 3.5–5.1)
Sodium: 127 mmol/L — ABNORMAL LOW (ref 135–145)
TCO2: 26 mmol/L (ref 22–32)
pCO2, Ven: 36.1 mmHg — ABNORMAL LOW (ref 44–60)
pH, Ven: 7.436 — ABNORMAL HIGH (ref 7.25–7.43)
pO2, Ven: 25 mmHg — CL (ref 32–45)

## 2023-08-27 LAB — COMPREHENSIVE METABOLIC PANEL WITH GFR
ALT: 10 U/L (ref 0–44)
AST: 18 U/L (ref 15–41)
Albumin: 3.7 g/dL (ref 3.5–5.0)
Alkaline Phosphatase: 69 U/L (ref 38–126)
Anion gap: 9 (ref 5–15)
BUN: 23 mg/dL (ref 8–23)
CO2: 24 mmol/L (ref 22–32)
Calcium: 9.7 mg/dL (ref 8.9–10.3)
Chloride: 97 mmol/L — ABNORMAL LOW (ref 98–111)
Creatinine, Ser: 1.07 mg/dL — ABNORMAL HIGH (ref 0.44–1.00)
GFR, Estimated: 58 mL/min — ABNORMAL LOW (ref >60)
Glucose, Bld: 265 mg/dL — ABNORMAL HIGH (ref 70–99)
Potassium: 4.8 mmol/L (ref 3.5–5.1)
Sodium: 130 mmol/L — ABNORMAL LOW (ref 135–145)
Total Bilirubin: 0.4 mg/dL (ref 0.0–1.2)
Total Protein: 7.2 g/dL (ref 6.5–8.1)

## 2023-08-27 LAB — CBC WITH DIFFERENTIAL/PLATELET
Abs Immature Granulocytes: 0.05 10*3/uL (ref 0.00–0.07)
Basophils Absolute: 0 10*3/uL (ref 0.0–0.1)
Basophils Relative: 0 %
Eosinophils Absolute: 0.1 10*3/uL (ref 0.0–0.5)
Eosinophils Relative: 1 %
HCT: 36.3 % (ref 36.0–46.0)
Hemoglobin: 12.3 g/dL (ref 12.0–15.0)
Immature Granulocytes: 1 %
Lymphocytes Relative: 18 %
Lymphs Abs: 1.8 10*3/uL (ref 0.7–4.0)
MCH: 29.3 pg (ref 26.0–34.0)
MCHC: 33.9 g/dL (ref 30.0–36.0)
MCV: 86.4 fL (ref 80.0–100.0)
Monocytes Absolute: 1 10*3/uL (ref 0.1–1.0)
Monocytes Relative: 10 %
Neutro Abs: 7 10*3/uL (ref 1.7–7.7)
Neutrophils Relative %: 70 %
Platelets: 408 10*3/uL — ABNORMAL HIGH (ref 150–400)
RBC: 4.2 MIL/uL (ref 3.87–5.11)
RDW: 12.8 % (ref 11.5–15.5)
WBC: 9.9 10*3/uL (ref 4.0–10.5)
nRBC: 0 % (ref 0.0–0.2)

## 2023-08-27 LAB — URINALYSIS, MICROSCOPIC (REFLEX)
Bacteria, UA: NONE SEEN
RBC / HPF: NONE SEEN RBC/hpf (ref 0–5)

## 2023-08-27 LAB — URINALYSIS, ROUTINE W REFLEX MICROSCOPIC
Bilirubin Urine: NEGATIVE
Glucose, UA: 250 mg/dL — AB
Hgb urine dipstick: NEGATIVE
Ketones, ur: NEGATIVE mg/dL
Nitrite: NEGATIVE
Protein, ur: NEGATIVE mg/dL
Specific Gravity, Urine: 1.025 (ref 1.005–1.030)
pH: 5.5 (ref 5.0–8.0)

## 2023-08-27 LAB — MAGNESIUM: Magnesium: 1.7 mg/dL (ref 1.7–2.4)

## 2023-08-27 LAB — BETA-HYDROXYBUTYRIC ACID: Beta-Hydroxybutyric Acid: 0.24 mmol/L (ref 0.05–0.27)

## 2023-08-27 LAB — LIPASE, BLOOD: Lipase: <10 U/L — ABNORMAL LOW (ref 11–51)

## 2023-08-27 NOTE — ED Triage Notes (Signed)
 Pt states she had blood work done yesterday and was called to come to ED for further evaluation. K+ 6.1 and glucose >600

## 2023-08-27 NOTE — ED Provider Notes (Signed)
 Blood pressure 125/76, pulse 92, temperature 97.6 F (36.4 C), temperature source Oral, resp. rate 16, height 5' 5 (1.651 m), weight 42.8 kg, SpO2 99%.  Assuming care from Dr. Luberta Ruse.  In short, Chelsea Ross is a 63 y.o. female with a chief complaint of abnormal labs .  Refer to the original H&P for additional details.  The current plan of care is to follow up on labs.  Labs returned showing hyperglycemia without evidence of DKA.  Potassium is normal at 4.8.  Discussed lab results with patient.  She is completely asymptomatic.  Stable for discharge.   EKG Interpretation Date/Time:  Wednesday August 27 2023 06:37:24 EDT Ventricular Rate:  85 PR Interval:  138 QRS Duration:  84 QT Interval:  374 QTC Calculation: 445 R Axis:   73  Text Interpretation: Sinus rhythm Confirmed by Abby Hocking [phone]) on 08/27/2023 9:45:42 AM          Chelsea Ross, Shereen Dike, MD 08/27/23 [phone]

## 2023-08-27 NOTE — ED Notes (Signed)
 Per RT ISTAT K+6.2

## 2023-08-27 NOTE — ED Provider Notes (Signed)
  EMERGENCY DEPARTMENT AT [facility] Provider Note   CSN: [id number] Arrival date & time: 08/27/23  4098     Patient presents with: abnormal labs   Chelsea Ross is a 63 y.o. female.  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 She was at a follow up yesterday with GI and got a call that her K was high and her glucose was high. On review of her outside records it does appear that her potassium was 6.1 with no reported hemolysis, sodium was 123 and her glucose was 605. Chloride also slightly low. States her glucose was 170's this AM (after eating toast which is really good for her as she stopped putting raisins on her toast) this morning and she is used to a large amount of fluctuation. Also noticed a hard bump in her epigastric area. Patient on potassium supplements from nephrologist. Denies chest pain, palpitations, abdominal pain, weakness. Did have significant weight loss at the end of last year and beginning of this year with difficulty gaining it back.         Prior to Admission medications   Medication Sig Start Date End Date Taking? Authorizing Provider  linagliptin (TRADJENTA) 5 MG TABS tablet Take 1 tablet by mouth daily. 07/04/20   [provider]  lipase/protease/amylase (CREON ) 36000 UNITS CPEP capsule Take 36,000 Units by mouth daily.    [provider]  magnesium  oxide (MAG-OX) 400 MG tablet Take 400 mg by mouth daily.    [provider]  metFORMIN (GLUCOPHAGE) 500 MG tablet Take 500 mg by mouth daily.    [provider]  midodrine  (PROAMATINE ) 10 MG tablet Take 0.5 tablets (5 mg total) by mouth 3 (three) times daily with meals. 03/27/23   Gherghe, Costin M, MD  pantoprazole  (PROTONIX ) 40 MG tablet Take 40 mg  by mouth daily.    [provider]  potassium chloride  (KLOR-CON  M) 10 MEQ tablet Take 10 mEq by mouth 2 (two) times a week. 02/25/23   [provider]    Allergies: Patient has no known allergies.    Review of Systems  Updated Vital Signs BP 125/76 (BP Location: Right Arm)   Pulse 92   Temp 97.6 F (36.4 C) (Oral)   Resp 16   Ht 5' 5 (1.651 m)   Wt 42.8 kg   SpO2 99%   BMI 15.71 kg/m   Physical Exam Vitals and nursing note reviewed.  Constitutional:      Appearance: She is well-developed.     Comments: Very skinny, almost cachectic appearing, sunken eyes  HENT:     Head: Normocephalic and atraumatic.   Cardiovascular:     Rate and Rhythm: Normal rate and regular rhythm.  Pulmonary:     Effort: No respiratory distress.     Breath sounds: No stridor.  Abdominal:     General: There is no distension.   Musculoskeletal:        General: No swelling or tenderness. Normal range of motion.     Cervical back: Normal range of motion.     Comments: Prominent xiphoid process   Neurological:     Mental Status: She is alert.     (all labs ordered are listed, but only abnormal results are displayed) Labs Reviewed  CBC WITH DIFFERENTIAL/PLATELET  COMPREHENSIVE METABOLIC PANEL WITH GFR  BLOOD GAS, VENOUS  URINALYSIS, ROUTINE W REFLEX MICROSCOPIC  BETA-HYDROXYBUTYRIC ACID  MAGNESIUM     EKG: None  Radiology: No results found.  {Document cardiac monitor, telemetry assessment procedure when appropriate:32947} Procedures   Medications Ordered in the ED - No data to display    {Click here for ABCD2, HEART and other calculators REFRESH Note before signing:1}                              Medical Decision Making Amount and/or Complexity of Data Reviewed Labs: ordered. ECG/medicine tests: ordered.  Bump is her xiphoid process.  Will recheck labs for potassium, sodium and ensure no e/o DKA although unlikely.  Ecg with maybe mild peaked t waves but  super large, no other abnormalities. Will await labs prior to treatment.  ***  {Document critical care time when appropriate  Document review of labs and clinical decision tools ie CHADS2VASC2, etc  Document your independent review of radiology images and any outside records  Document your discussion with family members, caretakers and with consultants  Document social determinants of health affecting pt's care  Document your decision making why or why not admission, treatments were needed:32947:::1}   Final diagnoses:  None    ED Discharge Orders     None

## 2023-11-17 ENCOUNTER — Ambulatory Visit (INDEPENDENT_AMBULATORY_CARE_PROVIDER_SITE_OTHER)

## 2023-11-17 ENCOUNTER — Ambulatory Visit (INDEPENDENT_AMBULATORY_CARE_PROVIDER_SITE_OTHER): Admitting: Podiatry

## 2023-11-17 DIAGNOSIS — G629 Polyneuropathy, unspecified: Secondary | ICD-10-CM

## 2023-11-17 DIAGNOSIS — M79672 Pain in left foot: Secondary | ICD-10-CM

## 2023-11-17 DIAGNOSIS — E1140 Type 2 diabetes mellitus with diabetic neuropathy, unspecified: Secondary | ICD-10-CM | POA: Diagnosis not present

## 2023-11-17 DIAGNOSIS — M79671 Pain in right foot: Secondary | ICD-10-CM

## 2023-11-17 DIAGNOSIS — E1149 Type 2 diabetes mellitus with other diabetic neurological complication: Secondary | ICD-10-CM

## 2023-11-17 MED ORDER — GABAPENTIN 300 MG PO CAPS: 300.0000 mg | ORAL_CAPSULE | Freq: Three times a day (TID) | ORAL | 3 refills | Status: DC

## 2023-11-17 NOTE — Progress Notes (Unsigned)
n

## 2023-12-14 ENCOUNTER — Encounter: Payer: Self-pay | Admitting: Podiatry

## 2023-12-15 ENCOUNTER — Other Ambulatory Visit: Payer: Self-pay | Admitting: Lab

## 2023-12-15 MED ORDER — GABAPENTIN 300 MG PO CAPS: 300.0000 mg | ORAL_CAPSULE | Freq: Three times a day (TID) | ORAL | 3 refills | Status: DC

## 2023-12-15 NOTE — Telephone Encounter (Signed)
 Refill has been sent.

## 2024-03-07 ENCOUNTER — Encounter: Payer: Self-pay | Admitting: Podiatry

## 2024-03-15 ENCOUNTER — Encounter: Payer: Self-pay | Admitting: Podiatry

## 2024-03-15 ENCOUNTER — Other Ambulatory Visit: Payer: Self-pay | Admitting: Lab

## 2024-03-15 MED ORDER — GABAPENTIN 300 MG PO CAPS: 300.0000 mg | ORAL_CAPSULE | Freq: Three times a day (TID) | ORAL | 3 refills | Status: AC

## 2024-04-08 ENCOUNTER — Encounter: Payer: Self-pay | Admitting: Podiatry
